# Patient Record
Sex: Male | Born: 1955 | Race: White | Hispanic: No | Marital: Married | Smoking: Former smoker
Health system: Southern US, Community
[De-identification: ages and names within clinical notes are randomized; demographics above are authoritative.]

## PROBLEM LIST (undated history)

## (undated) DIAGNOSIS — I10 Essential (primary) hypertension: Secondary | ICD-10-CM

## (undated) DIAGNOSIS — R7303 Prediabetes: Secondary | ICD-10-CM

## (undated) DIAGNOSIS — G4730 Sleep apnea, unspecified: Secondary | ICD-10-CM

## (undated) DIAGNOSIS — Z87442 Personal history of urinary calculi: Secondary | ICD-10-CM

## (undated) HISTORY — PX: NO PAST SURGERIES: SHX2092

---

## 2005-06-26 HISTORY — PX: COLONOSCOPY: SHX174

## 2006-04-24 ENCOUNTER — Ambulatory Visit: Payer: Self-pay | Admitting: General Surgery

## 2012-04-11 LAB — HM HEPATITIS C SCREENING LAB: HM Hepatitis Screen: NEGATIVE

## 2012-04-26 ENCOUNTER — Ambulatory Visit: Payer: Self-pay | Admitting: Family Medicine

## 2012-05-21 ENCOUNTER — Ambulatory Visit: Payer: Self-pay | Admitting: Family Medicine

## 2012-06-10 DIAGNOSIS — N138 Other obstructive and reflux uropathy: Secondary | ICD-10-CM | POA: Insufficient documentation

## 2012-06-10 DIAGNOSIS — R339 Retention of urine, unspecified: Secondary | ICD-10-CM | POA: Insufficient documentation

## 2012-06-10 DIAGNOSIS — N401 Enlarged prostate with lower urinary tract symptoms: Secondary | ICD-10-CM | POA: Insufficient documentation

## 2014-09-04 DIAGNOSIS — N400 Enlarged prostate without lower urinary tract symptoms: Secondary | ICD-10-CM | POA: Insufficient documentation

## 2015-02-21 ENCOUNTER — Other Ambulatory Visit: Payer: Self-pay | Admitting: Family Medicine

## 2015-03-19 ENCOUNTER — Encounter: Payer: Self-pay | Admitting: Family Medicine

## 2015-03-19 ENCOUNTER — Ambulatory Visit (INDEPENDENT_AMBULATORY_CARE_PROVIDER_SITE_OTHER): Payer: PRIVATE HEALTH INSURANCE | Admitting: Family Medicine

## 2015-03-19 VITALS — BP 122/78 | HR 60 | Temp 98.4°F | Resp 16 | Wt 185.0 lb

## 2015-03-19 DIAGNOSIS — M7581 Other shoulder lesions, right shoulder: Secondary | ICD-10-CM | POA: Insufficient documentation

## 2015-03-19 DIAGNOSIS — R197 Diarrhea, unspecified: Secondary | ICD-10-CM | POA: Diagnosis not present

## 2015-03-19 DIAGNOSIS — R0683 Snoring: Secondary | ICD-10-CM

## 2015-03-19 DIAGNOSIS — G4733 Obstructive sleep apnea (adult) (pediatric): Secondary | ICD-10-CM

## 2015-03-19 DIAGNOSIS — M778 Other enthesopathies, not elsewhere classified: Secondary | ICD-10-CM | POA: Insufficient documentation

## 2015-03-19 DIAGNOSIS — N400 Enlarged prostate without lower urinary tract symptoms: Secondary | ICD-10-CM

## 2015-03-19 DIAGNOSIS — E7800 Pure hypercholesterolemia, unspecified: Secondary | ICD-10-CM

## 2015-03-19 NOTE — Patient Instructions (Signed)

## 2015-03-19 NOTE — Progress Notes (Signed)
Patient ID: ORLANDER NORWOOD, male   DOB: Mar 19, 1956, 59 y.o.   MRN: [id number] Name: LANORRIS KALISZ   MRN: [id number]    DOB: [birth year]   Date:03/19/2015       Progress Note  Subjective  Chief Complaint  Chief Complaint  Patient presents with  . Fatigue  . Diarrhea    Diarrhea  This is a new problem. The current episode started in the past 7 days. The stool consistency is described as watery. Associated symptoms include myalgias and sweats. Pertinent negatives include no abdominal pain, chills or vomiting. Nothing aggravates the symptoms. He has tried analgesics for the symptoms.   No problem-specific assessment & plan notes found for this encounter.  History reviewed. No pertinent past medical history.  Social History  Substance Use Topics  . Smoking status: Former Research scientist (life sciences)  . Smokeless tobacco: Not on file     Comment: QUIT IN 1970'S  . Alcohol Use: 0.0 oz/week    0 Standard drinks or equivalent per week     Comment: OCCASIONALLY   Family History  Problem Relation Age of Onset  . Dementia Mother   . Ovarian cancer Mother   . Stroke Father   . Diabetes Father    Past Surgical History  Procedure Laterality Date  . No past surgeries      Current outpatient prescriptions:  .  Omega 3 1200 MG CAPS, Take 2 capsules by mouth daily., Disp: , Rfl:  .  pravastatin (PRAVACHOL) 20 MG tablet, TAKE ONE TABLET BY MOUTH ONE TIME DAILY, Disp: 30 tablet, Rfl: 6  No Known Allergies  Review of Systems  Constitutional: Negative for chills.  HENT: Negative.   Eyes: Negative.   Respiratory: Negative.   Cardiovascular: Negative.   Gastrointestinal: Positive for diarrhea. Negative for vomiting and abdominal pain.  Genitourinary: Negative.   Musculoskeletal: Positive for myalgias.  Skin: Negative.   Neurological: Positive for weakness.  Endo/Heme/Allergies: Negative.   Psychiatric/Behavioral: Negative.    Objective  Filed Vitals:   03/19/15 1605  BP: 122/78  Pulse: 60   Temp: 98.4 F (36.9 C)  TempSrc: Oral  Resp: 16  Weight: 185 lb (83.915 kg)   Physical Exam  Constitutional: He is oriented to person, place, and time and well-developed, well-nourished, and in no distress.  HENT:  Head: Normocephalic and atraumatic.  Eyes: Conjunctivae and EOM are normal.  Neck: Neck supple.  Cardiovascular: Normal rate.   Pulmonary/Chest: Breath sounds normal.  Abdominal: Soft. He exhibits no distension and no mass. There is no tenderness. There is no rebound and no guarding.  Hyperactive bowel sounds.  Neurological: He is alert and oriented to person, place, and time.  Skin: Skin is warm and dry.  Psychiatric: Memory, affect and judgment normal.   Assessment & Plan 1. Diarrhea Recent onset with fatigue. No vomiting but appetite has diminished. No fever or loose stool today. Recommend Pepto-Bismol or Imodium-AD with increased fluid intake and bland diet. If no further improvement in 2 days, should return for lab tests.

## 2015-04-26 ENCOUNTER — Encounter: Payer: Self-pay | Admitting: Family Medicine

## 2015-04-26 ENCOUNTER — Ambulatory Visit (INDEPENDENT_AMBULATORY_CARE_PROVIDER_SITE_OTHER): Payer: PRIVATE HEALTH INSURANCE | Admitting: Family Medicine

## 2015-04-26 VITALS — BP 122/82 | HR 62 | Temp 98.3°F | Resp 16 | Ht 71.25 in | Wt 185.6 lb

## 2015-04-26 DIAGNOSIS — N400 Enlarged prostate without lower urinary tract symptoms: Secondary | ICD-10-CM

## 2015-04-26 DIAGNOSIS — Z0000 Encounter for general adult medical examination without abnormal findings: Secondary | ICD-10-CM | POA: Diagnosis not present

## 2015-04-26 DIAGNOSIS — G4733 Obstructive sleep apnea (adult) (pediatric): Secondary | ICD-10-CM

## 2015-04-26 DIAGNOSIS — Z23 Encounter for immunization: Secondary | ICD-10-CM | POA: Diagnosis not present

## 2015-04-26 DIAGNOSIS — E7800 Pure hypercholesterolemia, unspecified: Secondary | ICD-10-CM | POA: Diagnosis not present

## 2015-04-26 LAB — POCT URINALYSIS DIPSTICK
Bilirubin, UA: NEGATIVE
GLUCOSE UA: NEGATIVE
Ketones, UA: NEGATIVE
Leukocytes, UA: NEGATIVE
NITRITE UA: NEGATIVE
PH UA: 6.5
Protein, UA: NEGATIVE
Spec Grav, UA: >=1.03
UROBILINOGEN UA: 0.2

## 2015-04-26 NOTE — Progress Notes (Signed)
Patient ID: Wayne Holland, male   DOB: [birth year], 59 y.o.   MRN: [id number]       Patient: Wayne Holland, Male    DOB: Feb 15, 1956, 59 y.o.   MRN: [id number] Visit Date: 04/26/2015  Today's Provider: Vernie Murders, PA   Chief Complaint  Patient presents with  . Annual Exam   Subjective:    Annual physical exam Wayne Holland is a 59 y.o. male who presents today for health maintenance and complete physical. He feels well. He reports exercising twice a week. He reports he is sleeping well( average 6-7 hours a night) .  -----------------------------------------------------------------   Review of Systems  Constitutional: Negative.   HENT: Negative.   Eyes: Negative.   Respiratory: Negative.   Cardiovascular: Negative.   Gastrointestinal: Negative.   Endocrine: Negative.   Genitourinary: Negative.   Musculoskeletal: Negative.   Skin: Negative.   Allergic/Immunologic: Negative.   Neurological: Negative.   Hematological: Negative.   Psychiatric/Behavioral: Negative.   All other systems reviewed and are negative.   Social History He  reports that he has quit smoking. He does not have any smokeless tobacco history on file. He reports that he drinks alcohol. He reports that he does not use illicit drugs. Social History   Social History  . Marital Status: Married    Spouse Name: N/A  . Number of Children: N/A  . Years of Education: N/A   Social History Main Topics  . Smoking status: Former Research scientist (life sciences)  . Smokeless tobacco: None     Comment: QUIT IN 1970'S  . Alcohol Use: 0.0 oz/week    0 Standard drinks or equivalent per week     Comment: OCCASIONALLY  . Drug Use: No  . Sexual Activity: Not Asked   Other Topics Concern  . None   Social History Narrative    Patient Active Problem List   Diagnosis Date Noted  . Pure hypercholesterolemia 03/19/2015  . BPH (benign prostatic hyperplasia) 03/19/2015  . Obstructive sleep apnea 03/19/2015  . Snoring  03/19/2015  . Tendinitis of right shoulder 03/19/2015  . Incomplete bladder emptying 06/10/2012  . Benign prostatic hyperplasia with urinary obstruction 06/10/2012    Past Surgical History  Procedure Laterality Date  . No past surgeries      Family History  Family Status  Relation Status Death Age  . Mother Deceased 32  . Father Deceased 47   His family history includes Dementia in his mother; Diabetes in his father; Ovarian cancer in his mother; Stroke in his father.    No Known Allergies  Previous Medications   OMEGA 3 1200 MG CAPS    Take 2 capsules by mouth daily.   PRAVASTATIN (PRAVACHOL) 20 MG TABLET    TAKE ONE TABLET BY MOUTH ONE TIME DAILY   Patient Care Team: Margo Common, PA as PCP - General (Physician Assistant)    Objective:   Vitals: BP 122/82 mmHg  Pulse 62  Temp(Src) 98.3 F (36.8 C) (Oral)  Resp 16  Ht 5' 11.25" (1.81 m)  Wt 185 lb 9.6 oz (84.188 kg)  BMI 25.70 kg/m2   Physical Exam  Depression Screen Good spirits and no suicidal ideation.  Assessment & Plan:     Routine Health Maintenance and Physical Exam  Exercise Activities and Dietary recommendations Goals   Continue regular exercise 20-30 minutes 3-4 times a week with low fat diet.  Immunization History  Administered Date(s) Administered  . Tdap 04/07/2011    Health Maintenance  Topic  Date Due  . INFLUENZA VACCINE  01/25/2015  . COLONOSCOPY  04/24/2016  . TETANUS/TDAP  04/06/2021  . Hepatitis C Screening  Completed      Discussed health benefits of physical activity, and encouraged him to engage in regular exercise appropriate for his age and condition.    -------------------------------------------------------------------- 1. Annual physical exam Good general health. Last Tdap 04-07-11 and normal colonoscopy  04-24-06 by Dr. Bary Castilla. Feeling well without complaints.  - POCT urinalysis dipstick  2. Pure hypercholesterolemia Tolerating Pravastatin 20 mg qd without  side effects. Will recheck labs and continue low fat diet. - CBC with Differential/Platelet - COMPLETE METABOLIC PANEL WITH GFR - Lipid panel - TSH  3. Obstructive sleep apnea Sleeping well and rare snoring now. Sleep study on 04-24-06 showed desat to 81.2% and was on CPAP 8 cm H2O for a few years. Has not used it in recent year. Good energy level. May use prn.  4. BPH (benign prostatic hyperplasia) Followed by Dr. Jacqlyn Larsen annually (last exam with PSA was in February 2016). Denies nocturia, hesitancy or dribbling. Continue follow up with urologist annually.  5. Need for influenza vaccination - Flu Vaccine QUAD 36+ mos PF IM (Fluarix & Fluzone Quad PF)

## 2015-04-26 NOTE — Patient Instructions (Signed)

## 2015-04-27 LAB — COMPREHENSIVE METABOLIC PANEL
ALBUMIN: 4.6 g/dL (ref 3.5–5.5)
ALT: 24 IU/L (ref 0–44)
AST: 16 IU/L (ref 0–40)
Albumin/Globulin Ratio: 1.9 (ref 1.1–2.5)
Alkaline Phosphatase: 68 IU/L (ref 39–117)
BUN / CREAT RATIO: 12 (ref 9–20)
BUN: 11 mg/dL (ref 6–24)
Bilirubin Total: 0.7 mg/dL (ref 0.0–1.2)
CO2: 26 mmol/L (ref 18–29)
CREATININE: 0.9 mg/dL (ref 0.76–1.27)
Calcium: 9.5 mg/dL (ref 8.7–10.2)
Chloride: 97 mmol/L (ref 97–106)
GFR, EST AFRICAN AMERICAN: 108 mL/min/{1.73_m2} (ref >59)
GFR, EST NON AFRICAN AMERICAN: 93 mL/min/{1.73_m2} (ref >59)
GLOBULIN, TOTAL: 2.4 g/dL (ref 1.5–4.5)
GLUCOSE: 101 mg/dL — AB (ref 65–99)
Potassium: 4.7 mmol/L (ref 3.5–5.2)
Sodium: 139 mmol/L (ref 136–144)
TOTAL PROTEIN: 7 g/dL (ref 6.0–8.5)

## 2015-04-27 LAB — LIPID PANEL
CHOLESTEROL TOTAL: 165 mg/dL (ref 100–199)
Chol/HDL Ratio: 2.6 ratio units (ref 0.0–5.0)
HDL: 64 mg/dL (ref >39)
LDL Calculated: 84 mg/dL (ref 0–99)
TRIGLYCERIDES: 87 mg/dL (ref 0–149)
VLDL Cholesterol Cal: 17 mg/dL (ref 5–40)

## 2015-04-27 LAB — CBC WITH DIFFERENTIAL/PLATELET
BASOS ABS: 0 10*3/uL (ref 0.0–0.2)
Basos: 0 %
EOS (ABSOLUTE): 0 10*3/uL (ref 0.0–0.4)
Eos: 1 %
Hematocrit: 45.7 % (ref 37.5–51.0)
Hemoglobin: 16.5 g/dL (ref 12.6–17.7)
IMMATURE GRANS (ABS): 0 10*3/uL (ref 0.0–0.1)
Immature Granulocytes: 0 %
LYMPHS ABS: 2.2 10*3/uL (ref 0.7–3.1)
LYMPHS: 30 %
MCH: 32 pg (ref 26.6–33.0)
MCHC: 36.1 g/dL — AB (ref 31.5–35.7)
MCV: 89 fL (ref 79–97)
MONOCYTES: 7 %
Monocytes Absolute: 0.5 10*3/uL (ref 0.1–0.9)
NEUTROS ABS: 4.5 10*3/uL (ref 1.4–7.0)
Neutrophils: 62 %
PLATELETS: 260 10*3/uL (ref 150–379)
RBC: 5.15 x10E6/uL (ref 4.14–5.80)
RDW: 12.7 % (ref 12.3–15.4)
WBC: 7.2 10*3/uL (ref 3.4–10.8)

## 2015-04-27 LAB — TSH: TSH: 0.455 u[IU]/mL (ref 0.450–4.500)

## 2015-04-28 ENCOUNTER — Telehealth: Payer: Self-pay

## 2015-04-28 NOTE — Telephone Encounter (Signed)
-----   Message from Margo Common, [place] sent at 04/27/2015  6:05 PM EDT ----- All blood tests normal. Recheck with annual physical.

## 2015-04-28 NOTE — Telephone Encounter (Signed)
Patient's wife Olin Hauser advised as directed below.

## 2015-08-26 ENCOUNTER — Encounter: Payer: Self-pay | Admitting: Family Medicine

## 2015-08-26 ENCOUNTER — Ambulatory Visit
Admission: RE | Admit: 2015-08-26 | Discharge: 2015-08-26 | Disposition: A | Discharge status: Home / Self Care | Payer: PRIVATE HEALTH INSURANCE | Source: Ambulatory Visit | Attending: Family Medicine | Admitting: Family Medicine

## 2015-08-26 ENCOUNTER — Ambulatory Visit (INDEPENDENT_AMBULATORY_CARE_PROVIDER_SITE_OTHER): Payer: PRIVATE HEALTH INSURANCE | Admitting: Family Medicine

## 2015-08-26 DIAGNOSIS — M7989 Other specified soft tissue disorders: Secondary | ICD-10-CM | POA: Insufficient documentation

## 2015-08-26 DIAGNOSIS — M25572 Pain in left ankle and joints of left foot: Secondary | ICD-10-CM | POA: Insufficient documentation

## 2015-08-26 NOTE — Patient Instructions (Signed)
Ankle Sprain  An ankle sprain is an injury to the strong, fibrous tissues (ligaments) that hold the bones of your ankle joint together.   CAUSES  An ankle sprain is usually caused by a fall or by twisting your ankle. Ankle sprains most commonly occur when you step on the outer edge of your foot, and your ankle turns inward. People who participate in sports are more prone to these types of injuries.   SYMPTOMS    Pain in your ankle. The pain may be present at rest or only when you are trying to stand or walk.   Swelling.   Bruising. Bruising may develop immediately or within 1 to 2 days after your injury.   Difficulty standing or walking, particularly when turning corners or changing directions.  DIAGNOSIS   Your caregiver will ask you details about your injury and perform a physical exam of your ankle to determine if you have an ankle sprain. During the physical exam, your caregiver will press on and apply pressure to specific areas of your foot and ankle. Your caregiver will try to move your ankle in certain ways. An X-ray exam may be done to be sure a bone was not broken or a ligament did not separate from one of the bones in your ankle (avulsion fracture).   TREATMENT   Certain types of braces can help stabilize your ankle. Your caregiver can make a recommendation for this. Your caregiver may recommend the use of medicine for pain. If your sprain is severe, your caregiver may refer you to a surgeon who helps to restore function to parts of your skeletal system (orthopedist) or a physical therapist.  HOME CARE INSTRUCTIONS    Apply ice to your injury for 1-2 days or as directed by your caregiver. Applying ice helps to reduce inflammation and pain.    Put ice in a plastic bag.    Place a towel between your skin and the bag.    Leave the ice on for 15-20 minutes at a time, every 2 hours while you are awake.   Only take over-the-counter or prescription medicines for pain, discomfort, or fever as directed by  your caregiver.   Elevate your injured ankle above the level of your heart as much as possible for 2-3 days.   If your caregiver recommends crutches, use them as instructed. Gradually put weight on the affected ankle. Continue to use crutches or a cane until you can walk without feeling pain in your ankle.   If you have a plaster splint, wear the splint as directed by your caregiver. Do not rest it on anything harder than a pillow for the first 24 hours. Do not put weight on it. Do not get it wet. You may take it off to take a shower or bath.   You may have been given an elastic bandage to wear around your ankle to provide support. If the elastic bandage is too tight (you have numbness or tingling in your foot or your foot becomes cold and blue), adjust the bandage to make it comfortable.   If you have an air splint, you may blow more air into it or let air out to make it more comfortable. You may take your splint off at night and before taking a shower or bath. Wiggle your toes in the splint several times per day to decrease swelling.  SEEK MEDICAL CARE IF:    You have rapidly increasing bruising or swelling.   Your toes feel   extremely cold or you lose feeling in your foot.   Your pain is not relieved with medicine.  SEEK IMMEDIATE MEDICAL CARE IF:   Your toes are numb or blue.   You have severe pain that is increasing.  MAKE SURE YOU:    Understand these instructions.   Will watch your condition.   Will get help right away if you are not doing well or get worse.     This information is not intended to replace advice given to you by your health care provider. Make sure you discuss any questions you have with your health care provider.     Document Released: 06/12/2005 Document Revised: 07/03/2014 Document Reviewed: 06/24/2011  Elsevier Interactive Patient Education 2016 [employer]

## 2015-08-26 NOTE — Progress Notes (Signed)
Patient ID: Wayne Holland, male   DOB: 07-Nov-1955, 60 y.o.   MRN: [id number]       Patient: Wayne Holland Male    DOB: 01/26/1956   60 y.o.   MRN: [id number] Visit Date: 08/26/2015  Today's Provider: Vernie Murders, PA   Chief Complaint  Patient presents with  . Ankle Pain    X 1 week.    Subjective:    Ankle Pain  There was no injury mechanism. The pain is present in the left ankle. The pain is at a severity of 4/10. The pain is mild (to moderate). The pain has been worsening since onset. Associated symptoms include an inability to bear weight. Pertinent negatives include no numbness or tingling. The symptoms are aggravated by movement and palpation. He has tried nothing for the symptoms.  Patient reports that he has also noticed swelling in his left ankle. He reports that it is sometimes tender when palpitating. Denies any injuries.   No past medical history on file. Patient Active Problem List   Diagnosis Date Noted  . Pure hypercholesterolemia 03/19/2015  . BPH (benign prostatic hyperplasia) 03/19/2015  . Obstructive sleep apnea 03/19/2015  . Snoring 03/19/2015  . Tendinitis of right shoulder 03/19/2015  . Incomplete bladder emptying 06/10/2012  . Benign prostatic hyperplasia with urinary obstruction 06/10/2012   Past Surgical History  Procedure Laterality Date  . No past surgeries     Family History  Problem Relation Age of Onset  . Dementia Mother   . Ovarian cancer Mother   . Stroke Father   . Diabetes Father    No Known Allergies   Previous Medications   OMEGA 3 1200 MG CAPS    Take 2 capsules by mouth daily.   PRAVASTATIN (PRAVACHOL) 20 MG TABLET    TAKE ONE TABLET BY MOUTH ONE TIME DAILY    Review of Systems  Constitutional: Negative.   Cardiovascular: Negative.   Musculoskeletal: Positive for joint swelling.  Skin: Negative.   Neurological: Negative.  Negative for tingling and numbness.    Social History  Substance Use Topics  . Smoking  status: Former Research scientist (life sciences)  . Smokeless tobacco: Not on file     Comment: QUIT IN 1970'S  . Alcohol Use: 0.0 oz/week    0 Standard drinks or equivalent per week     Comment: OCCASIONALLY   Objective:   BP 122/80 mmHg  Pulse 60  Temp(Src) 99 F (37.2 C)  Resp 16  Ht 6' (1.829 m)  Wt 190 lb (86.183 kg)  BMI 25.76 kg/m2  Physical Exam  Constitutional: He is oriented to person, place, and time. He appears well-developed and well-nourished. No distress.  HENT:  Head: Normocephalic and atraumatic.  Right Ear: Hearing normal.  Left Ear: Hearing normal.  Nose: Nose normal.  Eyes: Conjunctivae and lids are normal. Right eye exhibits no discharge. Left eye exhibits no discharge. No scleral icterus.  Pulmonary/Chest: Effort normal. No respiratory distress.  Musculoskeletal: Normal range of motion.  Mild pain in left lateral ankle and plantar surface of the left heel. No redness or crepitus. Good pulses and sensation in skin. Some extra discomfort to invert ankle with weight bearing.  Neurological: He is alert and oriented to person, place, and time.  Skin: Skin is intact. No lesion and no rash noted.  Psychiatric: He has a normal mood and affect. His speech is normal and behavior is normal. Thought content normal.      Assessment & Plan:  1. Left ankle pain Onset over the past week without known injury. Has had some discomfort in the left heel over the past month but it is improved now. No fever, redness, bruising or swelling today. Good pulses and mild discomfort to invert the left ankle. Will get x-ray evaluation and recommend using Ibuprofen 200 mg 3 tablets TID with meals for 10-14 days. Recheck pending x-ray report. May use Ace wrap and good arch supports. - DG Ankle Complete Left       Vernie Murders, PA  [facility]

## 2015-09-22 ENCOUNTER — Other Ambulatory Visit: Payer: Self-pay | Admitting: Family Medicine

## 2015-09-22 DIAGNOSIS — E7800 Pure hypercholesterolemia, unspecified: Secondary | ICD-10-CM

## 2015-11-15 ENCOUNTER — Encounter: Payer: Self-pay | Admitting: Family Medicine

## 2015-11-15 ENCOUNTER — Ambulatory Visit (INDEPENDENT_AMBULATORY_CARE_PROVIDER_SITE_OTHER): Payer: PRIVATE HEALTH INSURANCE | Admitting: Family Medicine

## 2015-11-15 ENCOUNTER — Ambulatory Visit
Admission: RE | Admit: 2015-11-15 | Discharge: 2015-11-15 | Disposition: A | Discharge status: Home / Self Care | Payer: PRIVATE HEALTH INSURANCE | Source: Ambulatory Visit | Attending: Family Medicine | Admitting: Family Medicine

## 2015-11-15 VITALS — BP 142/84 | HR 64 | Temp 98.5°F | Resp 16 | Wt 188.0 lb

## 2015-11-15 DIAGNOSIS — M25512 Pain in left shoulder: Secondary | ICD-10-CM | POA: Insufficient documentation

## 2015-11-15 DIAGNOSIS — M19019 Primary osteoarthritis, unspecified shoulder: Secondary | ICD-10-CM | POA: Insufficient documentation

## 2015-11-15 DIAGNOSIS — M7532 Calcific tendinitis of left shoulder: Secondary | ICD-10-CM | POA: Diagnosis not present

## 2015-11-15 MED ORDER — MELOXICAM 15 MG PO TABS: 15.0000 mg | ORAL_TABLET | Freq: Every day | ORAL | Status: DC

## 2015-11-15 NOTE — Progress Notes (Signed)
Patient ID: Wayne Holland, male   DOB: 05-27-1956, 60 y.o.   MRN: [id number]       Patient: Wayne Holland Male    DOB: [birth year]   60 y.o.   MRN: [id number] Visit Date: 11/15/2015  Today's Provider: Vernie Murders, PA   Chief Complaint  Patient presents with  . Shoulder Pain   Subjective:    HPI  Patient states he developed left shoulder pain around Thursday last week, may 18th. He noticed it during the day. He does not recall any injury or any unusual activities that would have brought this on. Pain is sharp when he pushes the area of the left shoulder and when he tries to raise his left arm. No arm weakness, ability to grip objects is normal as long as he does not lift his left arm up. No tingling or numbness sensation in the area. He took Advil for the pain at first and then took Ibuprofen the past 2 days.  Patient Active Problem List   Diagnosis Date Noted  . Pure hypercholesterolemia 03/19/2015  . BPH (benign prostatic hyperplasia) 03/19/2015  . Obstructive sleep apnea 03/19/2015  . Snoring 03/19/2015  . Tendinitis of right shoulder 03/19/2015  . Incomplete bladder emptying 06/10/2012  . Benign prostatic hyperplasia with urinary obstruction 06/10/2012   Past Surgical History  Procedure Laterality Date  . No past surgeries     Family History  Problem Relation Age of Onset  . Dementia Mother   . Ovarian cancer Mother   . Stroke Father   . Diabetes Father    No Known Allergies Previous Medications   OMEGA 3 1200 MG CAPS    Take 2 capsules by mouth daily.   PRAVASTATIN (PRAVACHOL) 20 MG TABLET    TAKE ONE TABLET BY MOUTH ONE TIME DAILY    Review of Systems  Constitutional: Negative.   Respiratory: Negative.   Cardiovascular: Negative.   Musculoskeletal: Positive for arthralgias (decreased range of motion present due to pain).    Social History  Substance Use Topics  . Smoking status: Former Research scientist (life sciences)  . Smokeless tobacco: Not on file     Comment:  QUIT IN 1970'S  . Alcohol Use: 0.0 oz/week    0 Standard drinks or equivalent per week     Comment: OCCASIONALLY   Objective:   BP 142/84 mmHg  Pulse 64  Temp(Src) 98.5 F (36.9 C)  Resp 16  Wt 188 lb (85.276 kg)  Physical Exam  Constitutional: He is oriented to person, place, and time. He appears well-developed and well-nourished. No distress.  HENT:  Head: Normocephalic and atraumatic.  Right Ear: Hearing normal.  Left Ear: Hearing normal.  Nose: Nose normal.  Eyes: Conjunctivae and lids are normal. Right eye exhibits no discharge. Left eye exhibits no discharge. No scleral icterus.  Pulmonary/Chest: Effort normal. No respiratory distress.  Musculoskeletal: He exhibits tenderness.  Left anterior shoulder over biceps tendon. Sharp pain with elevating humerus laterally. Good grip strength and normal pulses.   Neurological: He is alert and oriented to person, place, and time.  Skin: Skin is intact. No lesion and no rash noted.  Psychiatric: He has a normal mood and affect. His speech is normal and behavior is normal. Thought content normal.      Assessment & Plan:     1. Left shoulder pain Onset over the past weekend without specific injury known. Will get x-ray evaluation and start NSAID. Suspect tendinitis versus bursitis. May need referral to orthopedist if no  better with conservative treatment. Apply moist heat and limit lifting. Recheck pending reports. - DG Shoulder Left - meloxicam (MOBIC) 15 MG tablet; Take 1 tablet (15 mg total) by mouth daily.  Dispense: 30 tablet; Refill: [place]

## 2015-11-15 NOTE — Patient Instructions (Signed)
Bursitis °Bursitis is inflammation and irritation of a bursa, which is one of the small, fluid-filled sacs that cushion and protect the moving parts of your body. These sacs are located between bones and muscles, muscle attachments, or skin areas next to bones. A bursa protects these structures from the wear and tear that results from frequent movement. °An inflamed bursa causes pain and swelling. Fluid may build up inside the sac. Bursitis is most common near joints, especially the knees, elbows, hips, and shoulders. °CAUSES °Bursitis can be caused by:  °· Injury from: °¨ A direct blow, like falling on your knee or elbow. °¨ Overuse of a joint (repetitive stress). °· Infection. This can happen if bacteria gets into a bursa through a cut or scrape near a joint. °· Diseases that cause joint inflammation, such as gout and rheumatoid arthritis. °RISK FACTORS °You may be at risk for bursitis if you:  °· Have a job or hobby that involves a lot of repetitive stress on your joints. °· Have a condition that weakens your body's defense system (immune system), such as diabetes, cancer, or HIV. °· Lift and reach overhead often. °· Kneel or lean on hard surfaces often. °· Run or walk often. °SIGNS AND SYMPTOMS °The most common signs and symptoms of bursitis are: °· Pain that gets worse when you move the affected body part or put weight on it. °· Inflammation. °· Stiffness. °Other signs and symptoms may include: °· Redness. °· Tenderness. °· Warmth. °· Pain that continues after rest. °· Fever and chills. This may occur in bursitis caused by infection. °DIAGNOSIS °Bursitis may be diagnosed by:  °· Medical history and physical exam. °· MRI. °· A procedure to drain fluid from the bursa with a needle (aspiration). The fluid may be checked for signs of infection or gout. °· Blood tests to rule out other causes of inflammation. °TREATMENT  °Bursitis can usually be treated at home with rest, ice, compression, and elevation (RICE). For  mild bursitis, RICE treatment may be all you need. Other treatments may include: °· Nonsteroidal anti-inflammatory drugs (NSAIDs) to treat pain and inflammation. °· Corticosteroids to fight inflammation. You may have these drugs injected into and around the area of bursitis. °· Aspiration of bursitis fluid to relieve pain and improve movement. °· Antibiotic medicine to treat an infected bursa. °· A splint, brace, or walking aid. °· Physical therapy if you continue to have pain or limited movement. °· Surgery to remove a damaged or infected bursa. This may be needed if you have a very bad case of bursitis or if other treatments have not worked. °HOME CARE INSTRUCTIONS  °· Take medicines only as directed by your health care provider. °· If you were prescribed an antibiotic medicine, finish it all even if you start to feel better. °· Rest the affected area as directed by your health care provider. °¨ Keep the area elevated. °¨ Avoid activities that make pain worse. °· Apply ice to the injured area: °¨ Place ice in a plastic bag. °¨ Place a towel between your skin and the bag. °¨ Leave the ice on for 20 minutes, 2-3 times a day. °· Use splints, braces, pads, or walking aids as directed by your health care provider. °· Keep all follow-up visits as directed by your health care provider. This is important. °PREVENTION  °· Wear knee pads if you kneel often. °· Wear sturdy running or walking shoes that fit you well. °· Take regular breaks from repetitive activity. °· Warm   up by stretching before doing any strenuous activity.  Maintain a healthy weight or lose weight as recommended by your health care provider. Ask your health care provider if you need help.  Exercise regularly. Start any new physical activity gradually. SEEK MEDICAL CARE IF:   Your bursitis is not responding to treatment or home care.  You have a fever.  You have chills.   This information is not intended to replace advice given to you by your  health care provider. Make sure you discuss any questions you have with your health care provider.   Document Released: 06/09/2000 Document Revised: 03/03/2015 Document Reviewed: 09/01/2013 Elsevier Interactive Patient Education 2016 Elsevier Inc[employer] Bicipital Tendonitis Bicipital tendonitis refers to redness, soreness, and swelling (inflammation) or irritation of the bicep tendon. The biceps muscle is located between the elbow and shoulder of the inner arm. The tendon heads, similar to pieces of rope, connect the bicep muscle to the shoulder socket. They are called short head and long head tendons. When tendonitis occurs, the long head tendon is inflamed and swollen, and may be thickened or partially torn.  Bicipital tendonitis can occur with other problems as well, such as arthritis in the shoulder or acromioclavicular joints, tears in the tendons, or other rotator cuff problems.  CAUSES  Overuse of of the arms for overhead activities is the major cause of tendonitis. Many athletes, such as swimmers, baseball players, and tennis players are prone to bicipital tendonitis. Jobs that require manual labor or routine chores, especially chores involving overhead activities can result in overuse and tendonitis. SYMPTOMS Symptoms may include:  Pain in and around the front of the shoulder. Pain may be worse with overhead motion.  Pain or aching that radiates down the arm.  Clicking or shifting sensations in the shoulder. DIAGNOSIS Your caregiver may perform the following:  Physical exam and tests of the biceps and shoulder to observe range of motion, strength, and stability.  X-rays or magnetic resonance imaging (MRI) to confirm the diagnosis. In most common cases, these tests are not necessary. Since other problems may exist in the shoulder or rotator cuff, additional tests may be recommended. TREATMENT Treatment may include the following:  Medications  Your caregiver may prescribe  over-the-counter pain relievers.  Steroid injections, such as cortisone, may be recommended. These may help to reduce inflammation and pain.  Physical Therapy - Your caregiver may recommend gentle exercises with the arm. These can help restore strength and range of motion. They may be done at home or with a physical therapist's supervision and input.  Surgery - Arthroscopic or open surgery sometimes is necessary. Surgery may include:  Reattachment or repair of the tendon at the shoulder socket.  Removal of the damaged section of the tendon.  Anchoring the tendon to a different area of the shoulder (tenodesis). HOME CARE INSTRUCTIONS   Avoid overhead motion of the affected arm or any other motion that causes pain.  Take medication for pain as directed. Do not take these for more than 3 weeks, unless directed to do so by your caregiver.  Ice the affected area for 20 minutes at a time, 3-4 times per day. Place a towel on the skin over the painful area and the ice or cold pack over the towel. Do not place ice directly on the skin.  Perform gentle exercises at home as directed. These will increase strength and flexibility. PREVENTION  Modify your activities as much as possible to protect your arm. A physical therapist or sports  medicine physician can help you understand options for safe motion.  Avoid repetitive overhead pulling, lifting, reaching, and throwing until your caregiver tells you it is ok to resume these activities. SEEK MEDICAL CARE IF:  Your pain worsens.  You have difficulty moving the affected arm.  You have trouble performing any of the self-care instructions. MAKE SURE YOU:   Understand these instructions.  Will watch your condition.  Will get help right away if you are not doing well or get worse.   This information is not intended to replace advice given to you by your health care provider. Make sure you discuss any questions you have with your health care  provider.   Document Released: 07/15/2010 Document Revised: 09/04/2011 Document Reviewed: 12/30/2014 Elsevier Interactive Patient Education [employer].

## 2016-04-28 ENCOUNTER — Other Ambulatory Visit: Payer: Self-pay | Admitting: Family Medicine

## 2016-04-28 DIAGNOSIS — E7800 Pure hypercholesterolemia, unspecified: Secondary | ICD-10-CM

## 2016-05-04 ENCOUNTER — Ambulatory Visit (INDEPENDENT_AMBULATORY_CARE_PROVIDER_SITE_OTHER): Payer: PRIVATE HEALTH INSURANCE | Admitting: Family Medicine

## 2016-05-04 ENCOUNTER — Encounter: Payer: Self-pay | Admitting: Family Medicine

## 2016-05-04 VITALS — BP 134/88 | HR 60 | Temp 98.1°F | Resp 16 | Wt 187.4 lb

## 2016-05-04 DIAGNOSIS — E7800 Pure hypercholesterolemia, unspecified: Secondary | ICD-10-CM

## 2016-05-04 DIAGNOSIS — G4733 Obstructive sleep apnea (adult) (pediatric): Secondary | ICD-10-CM | POA: Diagnosis not present

## 2016-05-04 DIAGNOSIS — Z0000 Encounter for general adult medical examination without abnormal findings: Secondary | ICD-10-CM | POA: Diagnosis not present

## 2016-05-04 DIAGNOSIS — N401 Enlarged prostate with lower urinary tract symptoms: Secondary | ICD-10-CM | POA: Diagnosis not present

## 2016-05-04 DIAGNOSIS — Z1211 Encounter for screening for malignant neoplasm of colon: Secondary | ICD-10-CM | POA: Diagnosis not present

## 2016-05-04 DIAGNOSIS — N138 Other obstructive and reflux uropathy: Secondary | ICD-10-CM | POA: Diagnosis not present

## 2016-05-04 DIAGNOSIS — Z23 Encounter for immunization: Secondary | ICD-10-CM

## 2016-05-04 DIAGNOSIS — Z114 Encounter for screening for human immunodeficiency virus [HIV]: Secondary | ICD-10-CM | POA: Diagnosis not present

## 2016-05-04 DIAGNOSIS — Z2911 Encounter for prophylactic immunotherapy for respiratory syncytial virus (RSV): Secondary | ICD-10-CM

## 2016-05-04 LAB — IFOBT (OCCULT BLOOD): IMMUNOLOGICAL FECAL OCCULT BLOOD TEST: NEGATIVE

## 2016-05-04 NOTE — Progress Notes (Signed)
Patient: Wayne Holland, Male    DOB: Oct 05, 1955, 60 y.o.   MRN: [id number] Visit Date: 05/04/2016  Today's Provider: Vernie Murders, PA   Chief Complaint  Patient presents with  . Annual Exam   Subjective:    Annual physical exam Wayne Holland is a 60 y.o. male who presents today for health maintenance and complete physical. He feels well. He reports exercising 3 times per week. He reports he is sleeping fairly well.  -----------------------------------------------------------------   Review of Systems  Constitutional: Negative.   HENT: Negative.   Eyes: Negative.   Respiratory: Negative.   Cardiovascular: Negative.   Gastrointestinal: Negative.   Endocrine: Negative.   Genitourinary: Negative.   Musculoskeletal: Negative.   Skin: Negative.   Allergic/Immunologic: Negative.   Neurological: Negative.   Hematological: Negative.   Psychiatric/Behavioral: Negative.     Social History      He  reports that he has quit smoking. He does not have any smokeless tobacco history on file. He reports that he drinks alcohol. He reports that he does not use drugs.       Social History   Social History  . Marital status: Married    Spouse name: N/A  . Number of children: N/A  . Years of education: N/A   Social History Main Topics  . Smoking status: Former Research scientist (life sciences)  . Smokeless tobacco: None     Comment: QUIT IN 1970'S  . Alcohol use 0.0 oz/week     Comment: OCCASIONALLY  . Drug use: No  . Sexual activity: Not Asked   Other Topics Concern  . None   Social History Narrative  . None    Patient Active Problem List   Diagnosis Date Noted  . Pure hypercholesterolemia 03/19/2015  . BPH (benign prostatic hyperplasia) 03/19/2015  . Obstructive sleep apnea 03/19/2015  . Snoring 03/19/2015  . Tendinitis of right shoulder 03/19/2015  . Incomplete bladder emptying 06/10/2012  . Benign prostatic hyperplasia with urinary obstruction 06/10/2012    Past  Surgical History:  Procedure Laterality Date  . NO PAST SURGERIES      Family History        Family Status  Relation Status  . Mother Deceased at age 81  . Father Deceased at age 56        His family history includes Dementia in his mother; Diabetes in his father; Ovarian cancer in his mother; Stroke in his father.     No Known Allergies   Current Outpatient Prescriptions:  .  Omega 3 1200 MG CAPS, Take 2 capsules by mouth daily., Disp: , Rfl:  .  pravastatin (PRAVACHOL) 20 MG tablet, TAKE ONE TABLET BY MOUTH ONE TIME DAILY, Disp: 30 tablet, Rfl: 6   Patient Care Team: Margo Common, PA as PCP - General (Physician Assistant)      Objective:   Vitals: BP 134/88 (BP Location: Right Arm, Patient Position: Sitting, Cuff Size: Normal)   Pulse 60   Temp 98.1 F (36.7 C) (Oral)   Resp 16   Wt 187 lb 6.4 oz (85 kg)   BMI 25.42 kg/m    Physical Exam  Constitutional: He is oriented to person, place, and time. He appears well-developed and well-nourished.  HENT:  Head: Normocephalic and atraumatic.  Right Ear: External ear normal.  Left Ear: External ear normal.  Nose: Nose normal.  Mouth/Throat: Oropharynx is clear and moist.  Eyes: Conjunctivae and EOM are normal. Pupils are equal, round, and  reactive to light. Right eye exhibits no discharge.  Neck: Normal range of motion. Neck supple. No tracheal deviation present. No thyromegaly present.  Cardiovascular: Normal rate, regular rhythm, normal heart sounds and intact distal pulses.   No murmur heard. Pulmonary/Chest: Effort normal and breath sounds normal. No respiratory distress. He has no wheezes. He has no rales. He exhibits no tenderness.  Abdominal: Soft. He exhibits no distension and no mass. There is no tenderness. There is no rebound and no guarding.  Genitourinary: Rectum normal. Rectal exam shows guaiac negative stool.  Musculoskeletal: Normal range of motion. He exhibits no edema or tenderness.    Lymphadenopathy:    He has no cervical adenopathy.  Neurological: He is alert and oriented to person, place, and time. He has normal reflexes. No cranial nerve deficit. He exhibits normal muscle tone. Coordination normal.  Skin: Skin is warm and dry. No rash noted. No erythema.  Psychiatric: He has a normal mood and affect. His behavior is normal. Judgment and thought content normal.     Depression Screen PHQ 2/9 Scores 05/04/2016  PHQ - 2 Score 0      Assessment & Plan:     Routine Health Maintenance and Physical Exam  Exercise Activities and Dietary recommendations Goals    None      Immunization History  Administered Date(s) Administered  . Influenza,inj,Quad PF,36+ Mos 04/26/2015  . Tdap 04/07/2011    Health Maintenance  Topic Date Due  . HIV Screening  12/16/1970  . ZOSTAVAX  12/16/2015  . INFLUENZA VACCINE  01/25/2016  . COLONOSCOPY  04/24/2016  . TETANUS/TDAP  04/06/2021  . Hepatitis C Screening  Completed     Discussed health benefits of physical activity, and encouraged him to engage in regular exercise appropriate for his age and condition.    --------------------------------------------------------------------  1. Annual physical exam Good general health. Continues to walk for exercise 20 minuts 3 days a week. Will up date immunizations. Due for repeat colonoscopy. Given anticipatory guidance. - CBC with Differential/Platelet - Comprehensive metabolic panel  2. Pure hypercholesterolemia Still taking Pravastatin 20 mg qd with Omega-3 Fish Oil qd without side effects. Will recheck labs and follow up pending reports. - CBC with Differential/Platelet - Comprehensive metabolic panel - Lipid panel - TSH  3. Benign prostatic hyperplasia with urinary obstruction No new obstructive symptoms. Followed by Dr. Jacqlyn Larsen annually.  4. Obstructive sleep apnea Sleeping well. Using CPAP at 8 cm H2O pressure each night. Tolerating well and good energy level. -  CBC with Differential/Platelet - TSH  5. Colon cancer screening Due for 10 year colonoscopy screening. Dr. Bary Castilla did the last one and he prefers to go back there. - Ambulatory referral to General Surgery  6. Need for zoster vaccination - Varicella-zoster vaccine subcutaneous  7. Need for influenza vaccination - Flu Vaccine QUAD 36+ mos PF IM (Fluarix & Fluzone Quad PF)  8. Screening for HIV (human immunodeficiency virus) - HIV antibody   Vernie Murders, PA  [facility]

## 2016-05-05 ENCOUNTER — Telehealth: Payer: Self-pay

## 2016-05-05 LAB — CBC WITH DIFFERENTIAL/PLATELET
BASOS ABS: 0 10*3/uL (ref 0.0–0.2)
Basos: 0 %
EOS (ABSOLUTE): 0.1 10*3/uL (ref 0.0–0.4)
Eos: 2 %
Hematocrit: 45.2 % (ref 37.5–51.0)
Hemoglobin: 16.1 g/dL (ref 12.6–17.7)
IMMATURE GRANS (ABS): 0 10*3/uL (ref 0.0–0.1)
IMMATURE GRANULOCYTES: 0 %
LYMPHS: 31 %
Lymphocytes Absolute: 1.9 10*3/uL (ref 0.7–3.1)
MCH: 31.4 pg (ref 26.6–33.0)
MCHC: 35.6 g/dL (ref 31.5–35.7)
MCV: 88 fL (ref 79–97)
MONOS ABS: 0.6 10*3/uL (ref 0.1–0.9)
Monocytes: 9 %
NEUTROS ABS: 3.5 10*3/uL (ref 1.4–7.0)
NEUTROS PCT: 58 %
PLATELETS: 302 10*3/uL (ref 150–379)
RBC: 5.12 x10E6/uL (ref 4.14–5.80)
RDW: 12.6 % (ref 12.3–15.4)
WBC: 6 10*3/uL (ref 3.4–10.8)

## 2016-05-05 LAB — COMPREHENSIVE METABOLIC PANEL
ALT: 31 IU/L (ref 0–44)
AST: 21 IU/L (ref 0–40)
Albumin/Globulin Ratio: 1.7 (ref 1.2–2.2)
Albumin: 4.5 g/dL (ref 3.6–4.8)
Alkaline Phosphatase: 73 IU/L (ref 39–117)
BILIRUBIN TOTAL: 0.6 mg/dL (ref 0.0–1.2)
BUN/Creatinine Ratio: 13 (ref 10–24)
BUN: 13 mg/dL (ref 8–27)
CO2: 27 mmol/L (ref 18–29)
Calcium: 9.6 mg/dL (ref 8.6–10.2)
Chloride: 97 mmol/L (ref 96–106)
Creatinine, Ser: 0.99 mg/dL (ref 0.76–1.27)
GFR calc non Af Amer: 82 mL/min/{1.73_m2} (ref >59)
GFR, EST AFRICAN AMERICAN: 95 mL/min/{1.73_m2} (ref >59)
GLUCOSE: 94 mg/dL (ref 65–99)
Globulin, Total: 2.7 g/dL (ref 1.5–4.5)
POTASSIUM: 4.9 mmol/L (ref 3.5–5.2)
Sodium: 139 mmol/L (ref 134–144)
TOTAL PROTEIN: 7.2 g/dL (ref 6.0–8.5)

## 2016-05-05 LAB — LIPID PANEL
CHOL/HDL RATIO: 3.2 ratio (ref 0.0–5.0)
CHOLESTEROL TOTAL: 171 mg/dL (ref 100–199)
HDL: 53 mg/dL (ref >39)
LDL Calculated: 100 mg/dL — ABNORMAL HIGH (ref 0–99)
TRIGLYCERIDES: 91 mg/dL (ref 0–149)
VLDL Cholesterol Cal: 18 mg/dL (ref 5–40)

## 2016-05-05 LAB — TSH: TSH: 0.387 u[IU]/mL — AB (ref 0.450–4.500)

## 2016-05-05 LAB — HIV ANTIBODY (ROUTINE TESTING W REFLEX): HIV SCREEN 4TH GENERATION: NONREACTIVE

## 2016-05-05 NOTE — Telephone Encounter (Signed)
-----   Message from Margo Common, [place] sent at 05/05/2016  7:52 AM EST ----- All blood tests essentially normal. Only slight variation in thyroid test (TSH). Recheck annually.

## 2016-05-05 NOTE — Telephone Encounter (Signed)
Patient has been advised. KW 

## 2016-05-22 ENCOUNTER — Encounter: Payer: Self-pay | Admitting: *Deleted

## 2016-05-24 ENCOUNTER — Ambulatory Visit (INDEPENDENT_AMBULATORY_CARE_PROVIDER_SITE_OTHER): Payer: PRIVATE HEALTH INSURANCE | Admitting: General Surgery

## 2016-05-24 ENCOUNTER — Encounter: Payer: Self-pay | Admitting: General Surgery

## 2016-05-24 VITALS — BP 156/86 | HR 76 | Resp 12 | Ht 71.0 in | Wt 170.0 lb

## 2016-05-24 DIAGNOSIS — Z1211 Encounter for screening for malignant neoplasm of colon: Secondary | ICD-10-CM | POA: Insufficient documentation

## 2016-05-24 MED ORDER — POLYETHYLENE GLYCOL 3350 17 GM/SCOOP PO POWD: ORAL | 0 refills | Status: DC

## 2016-05-24 NOTE — Patient Instructions (Signed)
Colonoscopy, Adult A colonoscopy is an exam to look at the entire large intestine. During the exam, a lubricated, bendable tube is inserted into the anus and then passed into the rectum, colon, and other parts of the large intestine. A colonoscopy is often done as a part of normal colorectal screening or in response to certain symptoms, such as anemia, persistent diarrhea, abdominal pain, and blood in the stool. The exam can help screen for and diagnose medical problems, including:  Tumors.  Polyps.  Inflammation.  Areas of bleeding. Tell a health care provider about:  Any allergies you have.  All medicines you are taking, including vitamins, herbs, eye drops, creams, and over-the-counter medicines.  Any problems you or family members have had with anesthetic medicines.  Any blood disorders you have.  Any surgeries you have had.  Any medical conditions you have.  Any problems you have had passing stool. What are the risks? Generally, this is a safe procedure. However, problems may occur, including:  Bleeding.  A tear in the intestine.  A reaction to medicines given during the exam.  Infection (rare). What happens before the procedure? Eating and drinking restrictions  Follow instructions from your health care provider about eating and drinking, which may include:  A few days before the procedure - follow a low-fiber diet. Avoid nuts, seeds, dried fruit, raw fruits, and vegetables.  1-3 days before the procedure - follow a clear liquid diet. Drink only clear liquids, such as clear broth or bouillon, black coffee or tea, clear juice, clear soft drinks or sports drinks, gelatin desert, and popsicles. Avoid any liquids that contain red or purple dye.  On the day of the procedure - do not eat or drink anything during the 2 hours before the procedure, or within the time period that your health care provider recommends. Bowel prep  If you were prescribed an oral bowel prep to  clean out your colon:  Take it as told by your health care provider. Starting the day before your procedure, you will need to drink a large amount of medicated liquid. The liquid will cause you to have multiple loose stools until your stool is almost clear or light green.  If your skin or anus gets irritated from diarrhea, you may use these to relieve the irritation:  Medicated wipes, such as adult wet wipes with aloe and vitamin E.  A skin soothing-product like petroleum jelly.  If you vomit while drinking the bowel prep, take a break for up to 60 minutes and then begin the bowel prep again. If vomiting continues and you cannot take the bowel prep without vomiting, call your health care provider. General instructions  Ask your health care provider about changing or stopping your regular medicines. This is especially important if you are taking diabetes medicines or blood thinners.  Plan to have someone take you home from the hospital or clinic. What happens during the procedure?  An IV tube may be inserted into one of your veins.  You will be given medicine to help you relax (sedative).  To reduce your risk of infection:  Your health care team will wash or sanitize their hands.  Your anal area will be washed with soap.  You will be asked to lie on your side with your knees bent.  Your health care provider will lubricate a long, thin, flexible tube. The tube will have a camera and a light on the end.  The tube will be inserted into your anus.  The tube will be gently eased through your rectum and colon.  Air will be delivered into your colon to keep it open. You may feel some pressure or cramping.  The camera will be used to take images during the procedure.  A small tissue sample may be removed from your body to be examined under a microscope (biopsy). If any potential problems are found, the tissue will be sent to a lab for testing.  If small polyps are found, your health  care provider may remove them and have them checked for cancer cells.  The tube that was inserted into your anus will be slowly removed. The procedure may vary among health care providers and hospitals. What happens after the procedure?  Your blood pressure, heart rate, breathing rate, and blood oxygen level will be monitored until the medicines you were given have worn off.  Do not drive for 24 hours after the exam.  You may have a small amount of blood in your stool.  You may pass gas and have mild abdominal cramping or bloating due to the air that was used to inflate your colon during the exam.  It is up to you to get the results of your procedure. Ask your health care provider, or the department performing the procedure, when your results will be ready. This information is not intended to replace advice given to you by your health care provider. Make sure you discuss any questions you have with your health care provider. Document Released: 06/09/2000 Document Revised: 12/31/2015 Document Reviewed: 08/24/2015 Elsevier Interactive Patient Education  2017 Elsevier Inc.  

## 2016-05-24 NOTE — Progress Notes (Signed)
Patient ID: Wayne Holland, male   DOB: [birth year], 60 y.o.   MRN: [id number]  Chief Complaint  Patient presents with  . Colonoscopy    HPI Wayne Holland is a 60 y.o. male Here today for a evaluation of a screening colonoscopy. Last colonoscopy was in 2007. Patient states no GI problems at this time.  Denies family history of colon cancer or polyps.  HPI  No past medical history on file.  Past Surgical History:  Procedure Laterality Date  . COLONOSCOPY  2007  . NO PAST SURGERIES      Family History  Problem Relation Age of Onset  . Dementia Mother   . Ovarian cancer Mother   . Stroke Father   . Diabetes Father   . Colon cancer Neg Hx     Social History Social History  Substance Use Topics  . Smoking status: Former Research scientist (life sciences)  . Smokeless tobacco: Not on file     Comment: QUIT IN 1970'S  . Alcohol use 0.0 oz/week     Comment: OCCASIONALLY    No Known Allergies  Current Outpatient Prescriptions  Medication Sig Dispense Refill  . Omega 3 1200 MG CAPS Take 2 capsules by mouth daily.    . pravastatin (PRAVACHOL) 20 MG tablet TAKE ONE TABLET BY MOUTH ONE TIME DAILY 30 tablet 6  . polyethylene glycol powder (GLYCOLAX/MIRALAX) powder 255 grams one bottle for colonoscopy prep 255 g 0   No current facility-administered medications for this visit.     Review of Systems Review of Systems  Constitutional: Negative.   Respiratory: Negative.   Cardiovascular: Negative.   Gastrointestinal: Negative.     Blood pressure (!) 156/86, pulse 76, resp. rate 12, height 5\' 11"  (1.803 m), weight 170 lb (77.1 kg).  Physical Exam Physical Exam  Constitutional: He is oriented to person, place, and time. He appears well-developed and well-nourished.  HENT:  Mouth/Throat: Oropharynx is clear and moist.  Eyes: Conjunctivae are normal. No scleral icterus.  Neck: Neck supple.  Cardiovascular: Normal rate, regular rhythm and normal heart sounds.   Pulmonary/Chest: Effort normal  and breath sounds normal.  Abdominal: Soft. Bowel sounds are normal. There is no tenderness.  Lymphadenopathy:    He has no cervical adenopathy.  Neurological: He is alert and oriented to person, place, and time.  Skin: Skin is warm and dry.  Psychiatric: His behavior is normal.    Data Reviewed The 04/24/2006 colonoscopy report was reviewed. Normal exam.  Assessment    Candidate for screening colonoscopy.    Plan          Colonoscopy with possible biopsy/polypectomy prn: Information regarding the procedure, including its potential risks and complications (including but not limited to perforation of the bowel, which may require emergency surgery to repair, and bleeding) was verbally given to the patient. Educational information regarding lower intestinal endoscopy was given to the patient. Written instructions for how to complete the bowel prep using Miralax were provided. The importance of drinking ample fluids to avoid dehydration as a result of the prep emphasized.  Patient has been scheduled for a colonoscopy on 06-28-16 at [facility]. This patient has been asked to discontinue fish oil one week prior to procedure.   This information has been scribed by Gaspar Cola CMA.   [name] 05/24/2016, 9:34 PM

## 2016-06-01 ENCOUNTER — Encounter: Payer: Self-pay | Admitting: General Surgery

## 2016-06-21 ENCOUNTER — Telehealth: Payer: Self-pay | Admitting: *Deleted

## 2016-06-21 NOTE — Telephone Encounter (Signed)
Patient was contacted today and confirms no medication changes since last office visit.   He reports that he does have Miralax prescription.   We will proceed as scheduled with colonoscopy on 06-28-16 at [facility].   This patient was instructed to call the office should he have further questions.

## 2016-06-27 ENCOUNTER — Encounter: Payer: Self-pay | Admitting: *Deleted

## 2016-06-28 ENCOUNTER — Ambulatory Visit: Payer: PRIVATE HEALTH INSURANCE | Admitting: Anesthesiology

## 2016-06-28 ENCOUNTER — Encounter: Payer: Self-pay | Admitting: *Deleted

## 2016-06-28 ENCOUNTER — Ambulatory Visit
Admission: RE | Admit: 2016-06-28 | Discharge: 2016-06-28 | Disposition: A | Discharge status: Home / Self Care | Payer: PRIVATE HEALTH INSURANCE | Source: Ambulatory Visit | Attending: General Surgery | Admitting: General Surgery

## 2016-06-28 ENCOUNTER — Encounter
Admission: RE | Disposition: A | Discharge status: Home / Self Care | Payer: Self-pay | Source: Ambulatory Visit | Attending: General Surgery

## 2016-06-28 DIAGNOSIS — Z87891 Personal history of nicotine dependence: Secondary | ICD-10-CM | POA: Diagnosis not present

## 2016-06-28 DIAGNOSIS — K219 Gastro-esophageal reflux disease without esophagitis: Secondary | ICD-10-CM | POA: Insufficient documentation

## 2016-06-28 DIAGNOSIS — Z1211 Encounter for screening for malignant neoplasm of colon: Secondary | ICD-10-CM

## 2016-06-28 DIAGNOSIS — Z79899 Other long term (current) drug therapy: Secondary | ICD-10-CM | POA: Insufficient documentation

## 2016-06-28 DIAGNOSIS — G4730 Sleep apnea, unspecified: Secondary | ICD-10-CM | POA: Insufficient documentation

## 2016-06-28 DIAGNOSIS — D175 Benign lipomatous neoplasm of intra-abdominal organs: Secondary | ICD-10-CM | POA: Diagnosis not present

## 2016-06-28 HISTORY — PX: COLONOSCOPY WITH PROPOFOL: SHX5780

## 2016-06-28 HISTORY — DX: Sleep apnea, unspecified: G47.30

## 2016-06-28 SURGERY — COLONOSCOPY WITH PROPOFOL
Anesthesia: General

## 2016-06-28 MED ORDER — PROPOFOL 500 MG/50ML IV EMUL
INTRAVENOUS | Status: AC
  Filled 2016-06-28: qty 50

## 2016-06-28 MED ORDER — SODIUM CHLORIDE 0.9 % IV SOLN
INTRAVENOUS | Status: DC
  Administered 2016-06-28: 1000 mL via INTRAVENOUS

## 2016-06-28 MED ORDER — PROPOFOL 500 MG/50ML IV EMUL
INTRAVENOUS | Status: DC | PRN
Start: 2016-06-28 — End: 2016-06-28
  Administered 2016-06-28: 110 ug/kg/min via INTRAVENOUS

## 2016-06-28 MED ORDER — PROPOFOL 10 MG/ML IV BOLUS
INTRAVENOUS | Status: DC | PRN
  Administered 2016-06-28: 100 mg via INTRAVENOUS

## 2016-06-28 NOTE — Op Note (Signed)
[facility] Gastroenterology Patient Name: Wayne Holland Procedure Date: 06/28/2016 9:47 AM MRN: [id number] Account #: [id number] Date of Birth: Apr 07, 1956 Admit Type: Outpatient Age: 61 Room: [facility] ENDO ROOM 1 Gender: Male Note Status: Finalized Procedure:            Colonoscopy Indications:          Screening for colorectal malignant neoplasm Providers:            [name], MD Referring MD:         Signe Colt (Referring MD) Medicines:            Monitored Anesthesia Care Complications:        No immediate complications. Procedure:            Pre-Anesthesia Assessment:                       - Prior to the procedure, a History and Physical was                        performed, and patient medications, allergies and                        sensitivities were reviewed. The patient's tolerance of                        previous anesthesia was reviewed.                       - The risks and benefits of the procedure and the                        sedation options and risks were discussed with the                        patient. All questions were answered and informed                        consent was obtained.                       After obtaining informed consent, the colonoscope was                        passed under direct vision. Throughout the procedure,                        the patient's blood pressure, pulse, and oxygen                        saturations were monitored continuously. The Olympus                        CF-HQ190L Colonoscope (S#. [id number]) was introduced                        through the anus and advanced to the the terminal                        ileum. The colonoscopy was performed without  difficulty. The patient tolerated the procedure well.                        The quality of the bowel preparation was excellent. Findings:      There was a small lipoma, 8 mm in diameter, appendiceal orifice.   Biopsies were taken with a cold forceps for histology.      The retroflexed view of the distal rectum and anal verge was normal and       showed no anal or rectal abnormalities. Impression:           - Small lipoma at the appendiceal orifice. Biopsied.                       - The distal rectum and anal verge are normal on                        retroflexion view. Recommendation:       - Telephone endoscopist for pathology results in 1 week.                       - Repeat colonoscopy in 10 years for screening purposes. Procedure Code(s):    --- Professional ---                       [phone], Colonoscopy, flexible; with biopsy, single or                        multiple Diagnosis Code(s):    --- Professional ---                       Z12.11, Encounter for screening for malignant neoplasm                        of colon                       D17.5, Benign lipomatous neoplasm of intra-abdominal                        organs CPT copyright 2016 American Medical Association. All rights reserved. The codes documented in this report are preliminary and upon coder review may  be revised to meet current compliance requirements. [name], MD 06/28/2016 10:23:01 AM This report has been signed electronically. Number of Addenda: 0 Note Initiated On: 06/28/2016 9:47 AM Scope Withdrawal Time: 0 hours 10 minutes 40 seconds  Total Procedure Duration: 0 hours 17 minutes 51 seconds       [facility]

## 2016-06-28 NOTE — Anesthesia Preprocedure Evaluation (Signed)
Anesthesia Evaluation  Patient identified by MRN, date of birth, ID band Patient awake    Reviewed: Allergy & Precautions, H&P , NPO status , Patient's Chart, lab work & pertinent test results  History of Anesthesia Complications Negative for: history of anesthetic complications  Airway Mallampati: III  TM Distance: >3 FB Neck ROM: full    Dental  (+) Poor Dentition, Chipped   Pulmonary neg shortness of breath, sleep apnea , former smoker,    Pulmonary exam normal breath sounds clear to auscultation       Cardiovascular Exercise Tolerance: Good (-) angina(-) Past MI and (-) DOE negative cardio ROS Normal cardiovascular exam Rhythm:regular Rate:Normal     Neuro/Psych negative neurological ROS  negative psych ROS   GI/Hepatic negative GI ROS, Neg liver ROS, neg GERD  ,  Endo/Other  negative endocrine ROS  Renal/GU negative Renal ROS  negative genitourinary   Musculoskeletal   Abdominal   Peds  Hematology negative hematology ROS (+)   Anesthesia Other Findings Past Medical History: No date: Sleep apnea  Past Surgical History: 2007: COLONOSCOPY No date: NO PAST SURGERIES  BMI    Body Mass Index:  26.36 kg/m      Reproductive/Obstetrics negative OB ROS                             Anesthesia Physical Anesthesia Plan  ASA: III  Anesthesia Plan: General   Post-op Pain Management:    Induction:   Airway Management Planned:   Additional Equipment:   Intra-op Plan:   Post-operative Plan:   Informed Consent: I have reviewed the patients History and Physical, chart, labs and discussed the procedure including the risks, benefits and alternatives for the proposed anesthesia with the patient or authorized representative who has indicated his/her understanding and acceptance.   Dental Advisory Given  Plan Discussed with: Anesthesiologist, CRNA and Surgeon  Anesthesia Plan  Comments:         Anesthesia Quick Evaluation

## 2016-06-28 NOTE — Transfer of Care (Signed)
Immediate Anesthesia Transfer of Care Note  Patient: Wayne Holland  Procedure(s) Performed: Procedure(s): COLONOSCOPY WITH PROPOFOL (N/A)  Patient Location: PACU and Endoscopy Unit  Anesthesia Type:General  Level of Consciousness: patient cooperative and lethargic  Airway & Oxygen Therapy: Patient Spontanous Breathing and Patient connected to nasal cannula oxygen  Post-op Assessment: Report given to RN and Post -op Vital signs reviewed and stable  Post vital signs: Reviewed and stable  Last Vitals:  Vitals:   06/28/16 0935 06/28/16 1027  BP: (!) 169/107 110/60  Pulse: 72 70  Resp: 16 17  Temp: (!) 35.7 C     Last Pain:  Vitals:   06/28/16 0935  TempSrc: Tympanic         Complications: No apparent anesthesia complications

## 2016-06-28 NOTE — Anesthesia Postprocedure Evaluation (Signed)
Anesthesia Post Note  Patient: Wayne Holland  Procedure(s) Performed: Procedure(s) (LRB): COLONOSCOPY WITH PROPOFOL (N/A)  Patient location during evaluation: Endoscopy Anesthesia Type: General Level of consciousness: awake and alert Pain management: pain level controlled Vital Signs Assessment: post-procedure vital signs reviewed and stable Respiratory status: spontaneous breathing, nonlabored ventilation, respiratory function stable and patient connected to nasal cannula oxygen Cardiovascular status: blood pressure returned to baseline and stable Postop Assessment: no signs of nausea or vomiting Anesthetic complications: no     Last Vitals:  Vitals:   06/28/16 1050 06/28/16 1057  BP: (!) 154/86 (!) 153/95  Pulse:    Resp:    Temp:      Last Pain:  Vitals:   06/28/16 1050  TempSrc:   PainSc: 0-No pain                 Precious Haws Geena Weinhold

## 2016-06-28 NOTE — H&P (Signed)
Wayne Holland [id number] 26-Dec-1955     HPI:  Healthy male for screening colonoscopy. Tolerated prep well.   Prescriptions Prior to Admission  Medication Sig Dispense Refill Last Dose  . Omega 3 1200 MG CAPS Take 2 capsules by mouth daily.   Taking  . polyethylene glycol powder (GLYCOLAX/MIRALAX) powder 255 grams one bottle for colonoscopy prep 255 g 0   . pravastatin (PRAVACHOL) 20 MG tablet TAKE ONE TABLET BY MOUTH ONE TIME DAILY 30 tablet 6 Taking   No Known Allergies Past Medical History:  Diagnosis Date  . Sleep apnea    Past Surgical History:  Procedure Laterality Date  . COLONOSCOPY  2007  . NO PAST SURGERIES     Social History   Social History  . Marital status: Married    Spouse name: N/A  . Number of children: N/A  . Years of education: N/A   Occupational History  . Not on file.   Social History Main Topics  . Smoking status: Former Research scientist (life sciences)  . Smokeless tobacco: Never Used     Comment: QUIT IN 1970'S  . Alcohol use 0.0 oz/week     Comment: OCCASIONALLY  . Drug use: No  . Sexual activity: Not on file   Other Topics Concern  . Not on file   Social History Narrative  . No narrative on file   Social History   Social History Narrative  . No narrative on file     ROS: Negative.     PE: HEENT: Negative. Lungs: Clear. Cardio: RR.   Assessment/Plan:  Proceed with planned endoscopy.   [name] 06/28/2016

## 2016-06-29 ENCOUNTER — Encounter: Payer: Self-pay | Admitting: General Surgery

## 2016-06-29 LAB — SURGICAL PATHOLOGY

## 2016-07-03 ENCOUNTER — Telehealth: Payer: Self-pay | Admitting: *Deleted

## 2016-07-03 NOTE — Telephone Encounter (Signed)
Patient notified as instructed, verbalized understanding. Placed in recalls.

## 2016-07-03 NOTE — Telephone Encounter (Signed)
-----   Message from [name], MD sent at 06/29/2016  2:54 PM EST -----  Notify path fine. Repeat exam in10years.  ----- Message ----- From: Interface, Lab In Three Zero One Sent: 06/29/2016   2:24 PM To: [name], MD

## 2016-08-11 ENCOUNTER — Encounter: Payer: Self-pay | Admitting: Family Medicine

## 2016-08-11 ENCOUNTER — Other Ambulatory Visit: Payer: Self-pay

## 2016-08-11 ENCOUNTER — Ambulatory Visit (INDEPENDENT_AMBULATORY_CARE_PROVIDER_SITE_OTHER): Payer: PRIVATE HEALTH INSURANCE | Admitting: Family Medicine

## 2016-08-11 VITALS — BP 138/98 | HR 65 | Temp 98.1°F | Resp 16 | Wt 196.0 lb

## 2016-08-11 DIAGNOSIS — G4733 Obstructive sleep apnea (adult) (pediatric): Secondary | ICD-10-CM | POA: Diagnosis not present

## 2016-08-11 DIAGNOSIS — I10 Essential (primary) hypertension: Secondary | ICD-10-CM

## 2016-08-11 MED ORDER — LISINOPRIL 5 MG PO TABS: 5.0000 mg | ORAL_TABLET | Freq: Every day | ORAL | 3 refills | Status: DC

## 2016-08-11 NOTE — Patient Instructions (Signed)
Hypertension Hypertension, commonly called high blood pressure, is when the force of blood pumping through your arteries is too strong. Your arteries are the blood vessels that carry blood from your heart throughout your body. A blood pressure reading consists of a higher number over a lower number, such as 110/72. The higher number (systolic) is the pressure inside your arteries when your heart pumps. The lower number (diastolic) is the pressure inside your arteries when your heart relaxes. Ideally you want your blood pressure below 120/80. Hypertension forces your heart to work harder to pump blood. Your arteries may become narrow or stiff. Having untreated or uncontrolled hypertension can cause heart attack, stroke, kidney disease, and other problems. What increases the risk? Some risk factors for high blood pressure are controllable. Others are not. Risk factors you cannot control include:  Race. You may be at higher risk if you are African American.  Age. Risk increases with age.  Gender. Men are at higher risk than women before age 45 years. After age 65, women are at higher risk than men. Risk factors you can control include:  Not getting enough exercise or physical activity.  Being overweight.  Getting too much fat, sugar, calories, or salt in your diet.  Drinking too much alcohol. What are the signs or symptoms? Hypertension does not usually cause signs or symptoms. Extremely high blood pressure (hypertensive crisis) may cause headache, anxiety, shortness of breath, and nosebleed. How is this diagnosed? To check if you have hypertension, your health care provider will measure your blood pressure while you are seated, with your arm held at the level of your heart. It should be measured at least twice using the same arm. Certain conditions can cause a difference in blood pressure between your right and left arms. A blood pressure reading that is higher than normal on one occasion does  not mean that you need treatment. If it is not clear whether you have high blood pressure, you may be asked to return on a different day to have your blood pressure checked again. Or, you may be asked to monitor your blood pressure at home for 1 or more weeks. How is this treated? Treating high blood pressure includes making lifestyle changes and possibly taking medicine. Living a healthy lifestyle can help lower high blood pressure. You may need to change some of your habits. Lifestyle changes may include:  Following the DASH diet. This diet is high in fruits, vegetables, and whole grains. It is low in salt, red meat, and added sugars.  Keep your sodium intake below 2,300 mg per day.  Getting at least 30-45 minutes of aerobic exercise at least 4 times per week.  Losing weight if necessary.  Not smoking.  Limiting alcoholic beverages.  Learning ways to reduce stress. Your health care provider may prescribe medicine if lifestyle changes are not enough to get your blood pressure under control, and if one of the following is true:  You are 18-59 years of age and your systolic blood pressure is above 140.  You are 60 years of age or older, and your systolic blood pressure is above 150.  Your diastolic blood pressure is above 90.  You have diabetes, and your systolic blood pressure is over 140 or your diastolic blood pressure is over 90.  You have kidney disease and your blood pressure is above 140/90.  You have heart disease and your blood pressure is above 140/90. Your personal target blood pressure may vary depending on your medical   conditions, your age, and other factors. Follow these instructions at home:  Have your blood pressure rechecked as directed by your health care provider.  Take medicines only as directed by your health care provider. Follow the directions carefully. Blood pressure medicines must be taken as prescribed. The medicine does not work as well when you skip  doses. Skipping doses also puts you at risk for problems.  Do not smoke.  Monitor your blood pressure at home as directed by your health care provider. Contact a health care provider if:  You think you are having a reaction to medicines taken.  You have recurrent headaches or feel dizzy.  You have swelling in your ankles.  You have trouble with your vision. Get help right away if:  You develop a severe headache or confusion.  You have unusual weakness, numbness, or feel faint.  You have severe chest or abdominal pain.  You vomit repeatedly.  You have trouble breathing. This information is not intended to replace advice given to you by your health care provider. Make sure you discuss any questions you have with your health care provider. Document Released: 06/12/2005 Document Revised: 11/18/2015 Document Reviewed: 04/04/2013 Elsevier Interactive Patient Education  2017 Elsevier Inc.  

## 2016-08-11 NOTE — Progress Notes (Signed)
Patient: Wayne Holland Male    DOB: 1955-12-07   61 y.o.   MRN: [id number] Visit Date: 08/11/2016  Today's Provider: Vernie Murders, PA   Chief Complaint  Patient presents with  . Hypertension   Subjective:    Hypertension  This is a new problem. Episode onset: patient noticed on January 3rd. Associated symptoms include headaches (at times). Risk factors for coronary artery disease include dyslipidemia and male gender. Past treatments include nothing.   Patient Active Problem List   Diagnosis Date Noted  . Encounter for screening colonoscopy 05/24/2016  . Pure hypercholesterolemia 03/19/2015  . BPH (benign prostatic hyperplasia) 03/19/2015  . Obstructive sleep apnea 03/19/2015  . Snoring 03/19/2015  . Tendinitis of right shoulder 03/19/2015  . Incomplete bladder emptying 06/10/2012  . Benign prostatic hyperplasia with urinary obstruction 06/10/2012   Past Surgical History:  Procedure Laterality Date  . COLONOSCOPY  2007  . COLONOSCOPY WITH PROPOFOL N/A 06/28/2016   Procedure: COLONOSCOPY WITH PROPOFOL;  Surgeon: [name], MD;  [facility] ENDOSCOPY;  Service: Endoscopy;  Laterality: N/A;  . NO PAST SURGERIES     Family History  Problem Relation Age of Onset  . Dementia Mother   . Ovarian cancer Mother   . Stroke Father   . Diabetes Father   . Colon cancer Neg Hx    No Known Allergies    Previous Medications   OMEGA 3 1200 MG CAPS    Take 2 capsules by mouth daily.   PRAVASTATIN (PRAVACHOL) 20 MG TABLET    TAKE ONE TABLET BY MOUTH ONE TIME DAILY    Review of Systems  Constitutional: Negative.   Respiratory: Negative.   Cardiovascular: Negative.   Neurological: Positive for headaches (at times).    Social History  Substance Use Topics  . Smoking status: Former Research scientist (life sciences)  . Smokeless tobacco: Never Used     Comment: QUIT IN 1970'S  . Alcohol use 0.0 oz/week     Comment: OCCASIONALLY   Objective:   BP (!) 138/98 (BP Location: Right Arm,  Patient Position: Sitting, Cuff Size: Normal)   Pulse 65   Temp 98.1 F (36.7 C) (Oral)   Resp 16   Wt 196 lb (88.9 kg)   SpO2 98%   BMI 27.34 kg/m   Physical Exam  Constitutional: He is oriented to person, place, and time. He appears well-developed and well-nourished. No distress.  HENT:  Head: Normocephalic and atraumatic.  Right Ear: Hearing normal.  Left Ear: Hearing normal.  Nose: Nose normal.  Eyes: Conjunctivae and lids are normal. Right eye exhibits no discharge. Left eye exhibits no discharge. No scleral icterus.  Neck: Neck supple.  Cardiovascular: Normal rate and regular rhythm.   Pulmonary/Chest: Effort normal and breath sounds normal. No respiratory distress.  Abdominal: Soft.  Musculoskeletal: Normal range of motion.  Neurological: He is alert and oriented to person, place, and time.  Skin: Skin is intact. No lesion and no rash noted.  Psychiatric: He has a normal mood and affect. His speech is normal and behavior is normal. Thought content normal.      Assessment & Plan:     1. Essential hypertension Has tried to exercise regularly, restrict salt/sodium in diet and follow the low fat diet. BP at home has been 150/90 to 170/96 over the past month. Will add ACE inhibitor and recheck in 1 month. - lisinopril (PRINIVIL,ZESTRIL) 5 MG tablet; Take 1 tablet (5 mg total) by mouth daily.  Dispense: 30 tablet; Refill: 3  2. Obstructive sleep apnea Still sleeping well. Using CPAP at 8 cm H2O each night for the past 4-5 years.

## 2016-09-08 ENCOUNTER — Ambulatory Visit (INDEPENDENT_AMBULATORY_CARE_PROVIDER_SITE_OTHER): Payer: PRIVATE HEALTH INSURANCE | Admitting: Family Medicine

## 2016-09-08 ENCOUNTER — Encounter: Payer: Self-pay | Admitting: Family Medicine

## 2016-09-08 VITALS — BP 118/78 | HR 68 | Temp 97.9°F | Resp 16 | Wt 196.0 lb

## 2016-09-08 DIAGNOSIS — I10 Essential (primary) hypertension: Secondary | ICD-10-CM | POA: Diagnosis not present

## 2016-09-08 NOTE — Progress Notes (Signed)
Patient: Wayne Holland Male    DOB: 1955-11-12   61 y.o.   MRN: [id number] Visit Date: 09/08/2016  Today's Provider: Vernie Murders, PA   Chief Complaint  Patient presents with  . Hypertension   Subjective:    HPI      Hypertension, follow-up:  BP Readings from Last 3 Encounters:  09/08/16 118/78  08/11/16 (!) 138/98  06/28/16 (!) 153/95    He was last seen for hypertension 4 weeks ago.  BP at that visit was 138/98. Management since that visit includes adding Lisinopril 5 mg. He reports excellent compliance with treatment. He is not having side effects.  He is exercising. Walks 2-3 times per week for 30-45 minutes. He is adherent to low salt diet.   Outside blood pressures are about 130's/70's-80's. He is experiencing none.  Patient denies chest pain, chest pressure/discomfort, claudication, dyspnea, exertional chest pressure/discomfort, fatigue, irregular heart beat, lower extremity edema, near-syncope, orthopnea, palpitations and syncope.   Cardiovascular risk factors include advanced age (older than 57 for men, 42 for women), dyslipidemia, hypertension and male gender.       Weight trend: stable Wt Readings from Last 3 Encounters:  09/08/16 196 lb (88.9 kg)  08/11/16 196 lb (88.9 kg)  06/28/16 189 lb (85.7 kg)    Current diet: in general, a "healthy" diet    ------------------------------------------------------------------------ Patient Active Problem List   Diagnosis Date Noted  . Essential hypertension 08/11/2016  . Encounter for screening colonoscopy 05/24/2016  . Pure hypercholesterolemia 03/19/2015  . BPH (benign prostatic hyperplasia) 03/19/2015  . Obstructive sleep apnea 03/19/2015  . Snoring 03/19/2015  . Tendinitis of right shoulder 03/19/2015  . Incomplete bladder emptying 06/10/2012  . Benign prostatic hyperplasia with urinary obstruction 06/10/2012   Past Surgical History:  Procedure Laterality Date  . COLONOSCOPY  2007  .  COLONOSCOPY WITH PROPOFOL N/A 06/28/2016   Procedure: COLONOSCOPY WITH PROPOFOL;  Surgeon: [name], MD;  Location: [facility] ENDOSCOPY;  Service: Endoscopy;  Laterality: N/A;  . NO PAST SURGERIES     Family History  Problem Relation Age of Onset  . Dementia Mother   . Ovarian cancer Mother   . Stroke Father   . Diabetes Father   . Colon cancer Neg Hx     No Known Allergies  Current Outpatient Prescriptions:  .  lisinopril (PRINIVIL,ZESTRIL) 5 MG tablet, Take 1 tablet (5 mg total) by mouth daily., Disp: 30 tablet, Rfl: 3 .  Omega 3 1200 MG CAPS, Take 2 capsules by mouth daily., Disp: , Rfl:  .  pravastatin (PRAVACHOL) 20 MG tablet, TAKE ONE TABLET BY MOUTH ONE TIME DAILY, Disp: 30 tablet, Rfl: 6  Review of Systems  Constitutional: Negative for activity change, appetite change, chills, diaphoresis, fatigue, fever and unexpected weight change.  Respiratory: Negative for shortness of breath.   Cardiovascular: Negative for chest pain, palpitations and leg swelling.    Social History  Substance Use Topics  . Smoking status: Former Smoker    Years: 4.00  . Smokeless tobacco: Never Used     Comment: QUIT IN 1970'S  . Alcohol use 0.0 oz/week     Comment: OCCASIONALLY   Objective:   BP 118/78 (BP Location: Right Arm, Patient Position: Sitting, Cuff Size: Large)   Pulse 68   Temp 97.9 F (36.6 C) (Oral)   Resp 16   Wt 196 lb (88.9 kg)   BMI 27.34 kg/m  Vitals:   09/08/16 0809  BP: 118/78  Pulse: 68  Resp: 16  Temp: 97.9 F (36.6 C)  TempSrc: Oral  Weight: 196 lb (88.9 kg)     Physical Exam  Constitutional: He is oriented to person, place, and time. He appears well-developed and well-nourished. No distress.  HENT:  Head: Normocephalic and atraumatic.  Right Ear: Hearing normal.  Left Ear: Hearing normal.  Nose: Nose normal.  Eyes: Conjunctivae and lids are normal. Right eye exhibits no discharge. Left eye exhibits no discharge. No scleral icterus.    Cardiovascular: Normal rate and regular rhythm.   Pulmonary/Chest: Effort normal and breath sounds normal. No respiratory distress.  Musculoskeletal: Normal range of motion.  Neurological: He is alert and oriented to person, place, and time.  Skin: Skin is intact. No lesion and no rash noted.  Psychiatric: He has a normal mood and affect. His speech is normal and behavior is normal. Thought content normal.      Assessment & Plan:     1. Essential hypertension Well controlled with the addition of ACEI to HCTZ. No side effects and tolerating it well. Continue present dosage and recheck in 3 months.     Patient seen and examined by Vernie Murders, PA, and note scribed by Renaldo Fiddler, CMA.  Vernie Murders, PA  [facility]

## 2016-11-20 ENCOUNTER — Other Ambulatory Visit: Payer: Self-pay | Admitting: Family Medicine

## 2016-11-20 DIAGNOSIS — E7800 Pure hypercholesterolemia, unspecified: Secondary | ICD-10-CM

## 2016-12-07 ENCOUNTER — Other Ambulatory Visit: Payer: Self-pay | Admitting: Family Medicine

## 2016-12-07 DIAGNOSIS — I10 Essential (primary) hypertension: Secondary | ICD-10-CM

## 2016-12-08 ENCOUNTER — Ambulatory Visit (INDEPENDENT_AMBULATORY_CARE_PROVIDER_SITE_OTHER): Payer: PRIVATE HEALTH INSURANCE | Admitting: Family Medicine

## 2016-12-08 ENCOUNTER — Encounter: Payer: Self-pay | Admitting: Family Medicine

## 2016-12-08 VITALS — BP 114/70 | HR 58 | Temp 97.8°F | Wt 190.4 lb

## 2016-12-08 DIAGNOSIS — I10 Essential (primary) hypertension: Secondary | ICD-10-CM | POA: Diagnosis not present

## 2016-12-08 DIAGNOSIS — G4733 Obstructive sleep apnea (adult) (pediatric): Secondary | ICD-10-CM | POA: Diagnosis not present

## 2016-12-08 DIAGNOSIS — E7800 Pure hypercholesterolemia, unspecified: Secondary | ICD-10-CM | POA: Diagnosis not present

## 2016-12-08 NOTE — Progress Notes (Signed)
Patient: Wayne Holland Male    DOB: [birth year]   61 y.o.   MRN: [id number] Visit Date: 12/08/2016  Today's Provider: Vernie Murders, PA   Chief Complaint  Patient presents with  . Hypertension  . Hyperlipidemia  . Follow-up   Subjective:    HPI  Hypertension, follow-up:  BP Readings from Last 3 Encounters:  12/08/16 114/70  09/08/16 118/78  08/11/16 (!) 138/98    He was last seen for hypertension 3 months ago.  BP at that visit was 118/78. Management since that visit includes continue medication.He reports good compliance with treatment. He is not having side effects.  He is exercising. He is adherent to low salt diet.   Outside blood pressures are being checked. He is experiencing none.  Patient denies chest pain, chest pressure/discomfort, irregular heart beat and palpitations.   Cardiovascular risk factors include advanced age (older than 89 for men, 71 for women), dyslipidemia, hypertension and male gender.  Use of agents associated with hypertension: none.   ------------------------------------------------------------------------    Lipid/Cholesterol, Follow-up:   Last seen for this 3 months ago.  Management since that visit includes continue medications.  Last Lipid Panel:    Component Value Date/Time   CHOL 171 05/04/2016 0000   TRIG 91 05/04/2016 0000   HDL 53 05/04/2016 0000   CHOLHDL 3.2 05/04/2016 0000   LDLCALC 100 (H) 05/04/2016 0000    He reports good compliance with treatment. He is not having side effects.   Wt Readings from Last 3 Encounters:  12/08/16 190 lb 6.4 oz (86.4 kg)  09/08/16 196 lb (88.9 kg)  08/11/16 196 lb (88.9 kg)    ------------------------------------------------------------------------ Patient Active Problem List   Diagnosis Date Noted  . Essential hypertension 08/11/2016  . Encounter for screening colonoscopy 05/24/2016  . Pure hypercholesterolemia 03/19/2015  . BPH (benign prostatic hyperplasia)  03/19/2015  . Obstructive sleep apnea 03/19/2015  . Snoring 03/19/2015  . Tendinitis of right shoulder 03/19/2015  . Incomplete bladder emptying 06/10/2012  . Benign prostatic hyperplasia with urinary obstruction 06/10/2012   Past Surgical History:  Procedure Laterality Date  . COLONOSCOPY  2007  . COLONOSCOPY WITH PROPOFOL N/A 06/28/2016   Procedure: COLONOSCOPY WITH PROPOFOL;  Surgeon: [name], MD;  [facility];  Service: Endoscopy;  Laterality: N/A;  . NO PAST SURGERIES     Family History  Problem Relation Age of Onset  . Dementia Mother   . Ovarian cancer Mother   . Stroke Father   . Diabetes Father   . Colon cancer Neg Hx    No Known Allergies   Previous Medications   LISINOPRIL (PRINIVIL,ZESTRIL) 5 MG TABLET    TAKE 1 TABLET (5 MG TOTAL) BY MOUTH DAILY.   OMEGA 3 1200 MG CAPS    Take 2 capsules by mouth daily.   PRAVASTATIN (PRAVACHOL) 20 MG TABLET    TAKE ONE TABLET BY MOUTH ONE TIME DAILY    Review of Systems  Constitutional: Negative.   Respiratory: Negative.   Cardiovascular: Negative.   Musculoskeletal: Negative.     Social History  Substance Use Topics  . Smoking status: Former Smoker    Years: 4.00  . Smokeless tobacco: Never Used     Comment: QUIT IN 1970'S  . Alcohol use 0.0 oz/week     Comment: OCCASIONALLY   Objective:   BP 114/70 (BP Location: Right Arm, Patient Position: Sitting, Cuff Size: Normal)   Pulse (!) 58   Temp 97.8 F (36.6 C) (Oral)  Wt 190 lb 6.4 oz (86.4 kg)   SpO2 96%   BMI 26.56 kg/m   Physical Exam  Constitutional: He is oriented to person, place, and time. He appears well-developed and well-nourished. No distress.  HENT:  Head: Normocephalic and atraumatic.  Right Ear: Hearing normal.  Left Ear: Hearing normal.  Nose: Nose normal.  Eyes: Conjunctivae and lids are normal. Right eye exhibits no discharge. Left eye exhibits no discharge. No scleral icterus.  Neck: Neck supple.  Cardiovascular:  Normal rate and regular rhythm.   Pulmonary/Chest: Effort normal and breath sounds normal. No respiratory distress.  Abdominal: Soft. Bowel sounds are normal.  Musculoskeletal: Normal range of motion.  Neurological: He is alert and oriented to person, place, and time.  Skin: Skin is intact. No lesion and no rash noted.  Psychiatric: He has a normal mood and affect. His speech is normal and behavior is normal. Thought content normal.      Assessment & Plan:     1. Essential hypertension Well controlled BP and stable on Lisinopril 5 mg qd. Recheck CBC, Lipid panel and CMP. Continue exercise and sodium restriction (has lost 6 lbs since November 2017). Recheck pending reports. - CBC with Differential/Platelet - Comprehensive metabolic panel - Lipid panel  2. Pure hypercholesterolemia Tolerating Pravastatin 20 mg qd with Omega-3 1200 mg 2 caps daily without side effects. Recheck CMP and lipid level. Continue low fat diet and regular exercise. Lab Results  Component Value Date   CHOL 171 05/04/2016   HDL 53 05/04/2016   LDLCALC 100 (H) 05/04/2016   TRIG 91 05/04/2016   CHOLHDL 3.2 05/04/2016    - Comprehensive metabolic panel - Lipid panel  3. Obstructive sleep apnea Still using CPAP at 8 cm H2O each night. Good energy level and well controlled hypertension. Recheck routine labs. - CBC with Differential/Platelet - Comprehensive metabolic panel

## 2016-12-09 LAB — CBC WITH DIFFERENTIAL/PLATELET
Basophils Absolute: 0 10*3/uL (ref 0.0–0.2)
Basos: 0 %
EOS (ABSOLUTE): 0.1 10*3/uL (ref 0.0–0.4)
Eos: 2 %
Hematocrit: 46.4 % (ref 37.5–51.0)
Hemoglobin: 16.1 g/dL (ref 13.0–17.7)
IMMATURE GRANULOCYTES: 0 %
Immature Grans (Abs): 0 10*3/uL (ref 0.0–0.1)
Lymphocytes Absolute: 2 10*3/uL (ref 0.7–3.1)
Lymphs: 38 %
MCH: 31.4 pg (ref 26.6–33.0)
MCHC: 34.7 g/dL (ref 31.5–35.7)
MCV: 90 fL (ref 79–97)
MONOS ABS: 0.5 10*3/uL (ref 0.1–0.9)
Monocytes: 10 %
NEUTROS PCT: 50 %
Neutrophils Absolute: 2.6 10*3/uL (ref 1.4–7.0)
PLATELETS: 255 10*3/uL (ref 150–379)
RBC: 5.13 x10E6/uL (ref 4.14–5.80)
RDW: 13.7 % (ref 12.3–15.4)
WBC: 5.3 10*3/uL (ref 3.4–10.8)

## 2016-12-09 LAB — COMPREHENSIVE METABOLIC PANEL
ALK PHOS: 65 IU/L (ref 39–117)
ALT: 35 IU/L (ref 0–44)
AST: 23 IU/L (ref 0–40)
Albumin/Globulin Ratio: 2 (ref 1.2–2.2)
Albumin: 4.9 g/dL — ABNORMAL HIGH (ref 3.6–4.8)
BUN/Creatinine Ratio: 16 (ref 10–24)
BUN: 16 mg/dL (ref 8–27)
Bilirubin Total: 0.8 mg/dL (ref 0.0–1.2)
CALCIUM: 9.7 mg/dL (ref 8.6–10.2)
CO2: 25 mmol/L (ref 20–29)
CREATININE: 0.99 mg/dL (ref 0.76–1.27)
Chloride: 99 mmol/L (ref 96–106)
GFR calc Af Amer: 95 mL/min/{1.73_m2} (ref >59)
GFR, EST NON AFRICAN AMERICAN: 82 mL/min/{1.73_m2} (ref >59)
Globulin, Total: 2.5 g/dL (ref 1.5–4.5)
Glucose: 107 mg/dL — ABNORMAL HIGH (ref 65–99)
POTASSIUM: 5 mmol/L (ref 3.5–5.2)
Sodium: 138 mmol/L (ref 134–144)
Total Protein: 7.4 g/dL (ref 6.0–8.5)

## 2016-12-09 LAB — LIPID PANEL
CHOL/HDL RATIO: 2.8 ratio (ref 0.0–5.0)
CHOLESTEROL TOTAL: 166 mg/dL (ref 100–199)
HDL: 59 mg/dL (ref >39)
LDL Calculated: 96 mg/dL (ref 0–99)
TRIGLYCERIDES: 57 mg/dL (ref 0–149)
VLDL Cholesterol Cal: 11 mg/dL (ref 5–40)

## 2016-12-11 NOTE — Progress Notes (Signed)
Advised  ED 

## 2017-01-18 ENCOUNTER — Other Ambulatory Visit: Payer: Self-pay | Admitting: Family Medicine

## 2017-01-18 ENCOUNTER — Telehealth: Payer: Self-pay | Admitting: Family Medicine

## 2017-01-18 DIAGNOSIS — I10 Essential (primary) hypertension: Secondary | ICD-10-CM

## 2017-01-18 MED ORDER — LISINOPRIL 5 MG PO TABS: 5.0000 mg | ORAL_TABLET | Freq: Every day | ORAL | 3 refills | Status: DC

## 2017-01-18 NOTE — Telephone Encounter (Signed)
[facility] pharmacy faxed a request for a 90-days supply for the following medication. Thanks CC  lisinopril (PRINIVIL,ZESTRIL) 5 MG tablet  >Take 1 tablet ( 5 MG total ) by mouth daily.

## 2017-05-10 ENCOUNTER — Encounter: Payer: Self-pay | Admitting: Family Medicine

## 2017-05-10 ENCOUNTER — Ambulatory Visit (INDEPENDENT_AMBULATORY_CARE_PROVIDER_SITE_OTHER): Payer: PRIVATE HEALTH INSURANCE | Admitting: Family Medicine

## 2017-05-10 VITALS — BP 112/80 | HR 63 | Temp 98.0°F | Resp 16 | Ht 71.0 in | Wt 192.2 lb

## 2017-05-10 DIAGNOSIS — N401 Enlarged prostate with lower urinary tract symptoms: Secondary | ICD-10-CM | POA: Diagnosis not present

## 2017-05-10 DIAGNOSIS — G4733 Obstructive sleep apnea (adult) (pediatric): Secondary | ICD-10-CM | POA: Diagnosis not present

## 2017-05-10 DIAGNOSIS — N138 Other obstructive and reflux uropathy: Secondary | ICD-10-CM

## 2017-05-10 DIAGNOSIS — Z23 Encounter for immunization: Secondary | ICD-10-CM

## 2017-05-10 DIAGNOSIS — E7800 Pure hypercholesterolemia, unspecified: Secondary | ICD-10-CM

## 2017-05-10 DIAGNOSIS — Z0000 Encounter for general adult medical examination without abnormal findings: Secondary | ICD-10-CM | POA: Diagnosis not present

## 2017-05-10 DIAGNOSIS — I10 Essential (primary) hypertension: Secondary | ICD-10-CM

## 2017-05-10 LAB — CBC WITH DIFFERENTIAL/PLATELET
BASOS PCT: 0.6 %
Basophils Absolute: 31 cells/uL (ref 0–200)
EOS PCT: 1.3 %
Eosinophils Absolute: 68 cells/uL (ref 15–500)
HEMATOCRIT: 45.9 % (ref 38.5–50.0)
HEMOGLOBIN: 16.2 g/dL (ref 13.2–17.1)
LYMPHS ABS: 1836 {cells}/uL (ref 850–3900)
MCH: 31 pg (ref 27.0–33.0)
MCHC: 35.3 g/dL (ref 32.0–36.0)
MCV: 87.8 fL (ref 80.0–100.0)
MPV: 9.6 fL (ref 7.5–12.5)
Monocytes Relative: 10.7 %
NEUTROS ABS: 2709 {cells}/uL (ref 1500–7800)
NEUTROS PCT: 52.1 %
Platelets: 268 10*3/uL (ref 140–400)
RBC: 5.23 10*6/uL (ref 4.20–5.80)
RDW: 12.2 % (ref 11.0–15.0)
Total Lymphocyte: 35.3 %
WBC mixed population: 556 cells/uL (ref 200–950)
WBC: 5.2 10*3/uL (ref 3.8–10.8)

## 2017-05-10 LAB — LIPID PANEL
Cholesterol: 165 mg/dL (ref <200)
HDL: 62 mg/dL (ref >40)
LDL Cholesterol (Calc): 89 mg/dL (calc)
NON-HDL CHOLESTEROL (CALC): 103 mg/dL (ref <130)
Total CHOL/HDL Ratio: 2.7 (calc) (ref <5.0)
Triglycerides: 48 mg/dL (ref <150)

## 2017-05-10 LAB — COMPLETE METABOLIC PANEL WITH GFR
AG Ratio: 1.9 (calc) (ref 1.0–2.5)
ALBUMIN MSPROF: 4.6 g/dL (ref 3.6–5.1)
ALKALINE PHOSPHATASE (APISO): 55 U/L (ref 40–115)
ALT: 31 U/L (ref 9–46)
AST: 19 U/L (ref 10–35)
BUN: 12 mg/dL (ref 7–25)
CHLORIDE: 103 mmol/L (ref 98–110)
CO2: 28 mmol/L (ref 20–32)
Calcium: 9.3 mg/dL (ref 8.6–10.3)
Creat: 0.91 mg/dL (ref 0.70–1.25)
GFR, EST AFRICAN AMERICAN: 105 mL/min/{1.73_m2} (ref >=60)
GFR, EST NON AFRICAN AMERICAN: 91 mL/min/{1.73_m2} (ref >=60)
GLUCOSE: 105 mg/dL — AB (ref 65–99)
Globulin: 2.4 g/dL (calc) (ref 1.9–3.7)
POTASSIUM: 4.9 mmol/L (ref 3.5–5.3)
SODIUM: 138 mmol/L (ref 135–146)
TOTAL PROTEIN: 7 g/dL (ref 6.1–8.1)
Total Bilirubin: 0.8 mg/dL (ref 0.2–1.2)

## 2017-05-10 LAB — TSH: TSH: 0.3 mIU/L — ABNORMAL LOW (ref 0.40–4.50)

## 2017-05-10 NOTE — Progress Notes (Signed)
Patient: Wayne Holland, Male    DOB: March 16, 1956, 61 y.o.   MRN: [id number] Visit Date: 05/10/2017  Today's Provider: Vernie Murders, PA   Chief Complaint  Patient presents with  . Annual Exam   Subjective:    Annual physical exam Wayne Holland is a 61 y.o. male who presents today for health maintenance and complete physical. He feels well. He reports exercising 3 days per week. He reports he is sleeping fairly well.  ----------------------------------------------------------------- Last CPE- 05/04/2016 Colonoscopy- 06/28/2016 Tetanus- 04/07/2011  Review of Systems  Constitutional: Negative.   HENT: Negative.   Eyes: Negative.   Respiratory: Negative.   Cardiovascular: Negative.   Gastrointestinal: Negative.   Endocrine: Negative.   Genitourinary: Negative.   Musculoskeletal: Negative.   Skin: Negative.   Allergic/Immunologic: Negative.   Neurological: Negative.   Hematological: Negative.   Psychiatric/Behavioral: Negative.     Social History      He  reports that he has quit smoking. He quit after 4.00 years of use. he has never used smokeless tobacco. He reports that he drinks alcohol. He reports that he does not use drugs.       Social History   Socioeconomic History  . Marital status: Married    Spouse name: None  . Number of children: None  . Years of education: None  . Highest education level: None  Social Needs  . Financial resource strain: None  . Food insecurity - worry: None  . Food insecurity - inability: None  . Transportation needs - medical: None  . Transportation needs - non-medical: None  Occupational History  . None  Tobacco Use  . Smoking status: Former Smoker    Years: 4.00  . Smokeless tobacco: Never Used  . Tobacco comment: QUIT IN 1970'S  Substance and Sexual Activity  . Alcohol use: Yes    Alcohol/week: 0.0 oz    Comment: OCCASIONALLY  . Drug use: No  . Sexual activity: None  Other Topics Concern  . None  Social  History Narrative  . None    Past Medical History:  Diagnosis Date  . Sleep apnea     Patient Active Problem List   Diagnosis Date Noted  . Essential hypertension 08/11/2016  . Encounter for screening colonoscopy 05/24/2016  . Pure hypercholesterolemia 03/19/2015  . BPH (benign prostatic hyperplasia) 03/19/2015  . Obstructive sleep apnea 03/19/2015  . Snoring 03/19/2015  . Tendinitis of right shoulder 03/19/2015  . Incomplete bladder emptying 06/10/2012  . Benign prostatic hyperplasia with urinary obstruction 06/10/2012    Past Surgical History:  Procedure Laterality Date  . COLONOSCOPY  2007  . COLONOSCOPY WITH PROPOFOL N/A 06/28/2016   Procedure: COLONOSCOPY WITH PROPOFOL;  Surgeon: [name], MD;  Location: [facility] ENDOSCOPY;  Service: Endoscopy;  Laterality: N/A;  . NO PAST SURGERIES      Family History        Family Status  Relation Name Status  . Mother  Deceased at age 21  . Father  Deceased at age 27  . Neg Hx  (Not Specified)        His family history includes Dementia in his mother; Diabetes in his father; Ovarian cancer in his mother; Stroke in his father.     No Known Allergies   Current Outpatient Medications:  .  lisinopril (PRINIVIL,ZESTRIL) 5 MG tablet, Take 1 tablet (5 mg total) by mouth daily., Disp: 90 tablet, Rfl: 3 .  Omega 3 1200 MG CAPS, Take 2 capsules by  mouth daily., Disp: , Rfl:  .  pravastatin (PRAVACHOL) 20 MG tablet, TAKE ONE TABLET BY MOUTH ONE TIME DAILY, Disp: 30 tablet, Rfl: 6    Objective:   Vitals: BP 112/80 (BP Location: Right Arm, Patient Position: Sitting, Cuff Size: Normal)   Pulse 63   Temp 98 F (36.7 C) (Oral)   Resp 16   Ht 5\' 11"  (1.803 m)   Wt 192 lb 3.2 oz (87.2 kg)   SpO2 99%   BMI 26.81 kg/m     Physical Exam  Constitutional: He is oriented to person, place, and time. He appears well-developed and well-nourished. No distress.  HENT:  Head: Normocephalic and atraumatic.  Right Ear: Hearing and  external ear normal.  Left Ear: Hearing and external ear normal.  Nose: Nose normal.  Mouth/Throat: Oropharynx is clear and moist.  Eyes: Conjunctivae and lids are normal. Right eye exhibits no discharge. Left eye exhibits no discharge. No scleral icterus.  Neck: Neck supple.  Cardiovascular: Normal rate and regular rhythm.  Pulmonary/Chest: Effort normal and breath sounds normal. No respiratory distress.  Abdominal: Soft. Bowel sounds are normal.  Musculoskeletal: Normal range of motion.  Neurological: He is alert and oriented to person, place, and time.  Skin: Skin is intact. No lesion and no rash noted.  Psychiatric: He has a normal mood and affect. His speech is normal and behavior is normal. Thought content normal.   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      Immunization History  Administered Date(s) Administered  . Influenza Split 04/09/2012  . Influenza,inj,Quad PF,6+ Mos 04/17/2013, 04/24/2014, 04/26/2015, 05/04/2016, 05/10/2017  . Tdap 04/07/2011  . Zoster 05/04/2016    Health Maintenance  Topic Date Due  . Samul Dada  04/06/2021  . COLONOSCOPY  06/28/2026  . INFLUENZA VACCINE  Completed  . Hepatitis C Screening  Completed  . HIV Screening  Completed     Discussed health benefits of physical activity, and encouraged him to engage in regular exercise appropriate for his age and condition.    --------------------------------------------------------------------  1. Annual physical exam Good general health. Immunizations up to date except flu shot (given today). Given anticipatory counseling.  2. Essential hypertension Well controlled without chest pains, palpitation or dyspnea. No peripheral edema.  Tolerated Lisinopril 5 mg qd. Recheck labs. - COMPLETE METABOLIC PANEL WITH GFR - Lipid panel - CBC with Differential/Platelet - TSH  3. Benign prostatic hyperplasia with urinary obstruction Followed by Dr. Jacqlyn Larsen (urologist). Schedule for recheck next week with PSA. Denies new changes in decreased stream and delay of micturition.  4. Pure hypercholesterolemia Tolerating Pravastatin 20 mg qd without side effects. Continue low fat diet and exercise. Recheck labs and follow up pending reports. - COMPLETE METABOLIC PANEL WITH GFR - Lipid panel - TSH  5. Obstructive sleep apnea Still using CPAP at 8 cm H2O by nasal pillows. Considering getting a mouth appliance to replace CPAP.  6. Need for influenza vaccination - Flu Vaccine QUAD 6+ mos PF IM (Fluarix Quad PF)   Vernie Murders, PA  [facility]

## 2017-06-09 ENCOUNTER — Other Ambulatory Visit: Payer: Self-pay | Admitting: Family Medicine

## 2017-06-09 DIAGNOSIS — E7800 Pure hypercholesterolemia, unspecified: Secondary | ICD-10-CM

## 2017-07-06 ENCOUNTER — Encounter: Payer: Self-pay | Admitting: Family Medicine

## 2017-07-09 ENCOUNTER — Other Ambulatory Visit: Payer: Self-pay | Admitting: Family Medicine

## 2017-07-09 ENCOUNTER — Telehealth: Payer: Self-pay

## 2017-07-09 DIAGNOSIS — R7989 Other specified abnormal findings of blood chemistry: Secondary | ICD-10-CM

## 2017-07-09 NOTE — Telephone Encounter (Signed)
Thyroid panel future order placed in chart for print out when he comes by to get labs done.

## 2017-07-09 NOTE — Telephone Encounter (Signed)
Pt is due to have his thyroid rechecked.  Please call when ready.   Contact:  [phone]    Thanks,   -Mickel Baas

## 2017-07-20 ENCOUNTER — Other Ambulatory Visit: Payer: Self-pay | Admitting: Family Medicine

## 2017-07-21 LAB — THYROID PANEL WITH TSH
Free Thyroxine Index: 1.9 (ref 1.2–4.9)
T3 Uptake Ratio: 27 % (ref 24–39)
T4, Total: 7.1 ug/dL (ref 4.5–12.0)
TSH: 0.365 u[IU]/mL — AB (ref 0.450–4.500)

## 2017-07-23 ENCOUNTER — Telehealth: Payer: Self-pay

## 2017-07-23 NOTE — Telephone Encounter (Signed)
Pt advised.   Thanks,   -Laura  

## 2017-07-23 NOTE — Telephone Encounter (Signed)
-----   Message from Margo Common, [place] sent at 07/22/2017  4:55 PM EST ----- Normal T4 and T3 with TSH slowly coming back toward normal range. No additional medications needed. Will recheck levels annually.

## 2017-12-30 ENCOUNTER — Other Ambulatory Visit: Payer: Self-pay | Admitting: Family Medicine

## 2017-12-30 DIAGNOSIS — E7800 Pure hypercholesterolemia, unspecified: Secondary | ICD-10-CM

## 2018-01-16 ENCOUNTER — Encounter: Payer: Self-pay | Admitting: Urology

## 2018-01-16 ENCOUNTER — Ambulatory Visit: Payer: PRIVATE HEALTH INSURANCE | Admitting: Urology

## 2018-01-16 VITALS — BP 112/71 | HR 72 | Ht 71.0 in | Wt 188.7 lb

## 2018-01-16 DIAGNOSIS — N401 Enlarged prostate with lower urinary tract symptoms: Secondary | ICD-10-CM

## 2018-01-16 DIAGNOSIS — N138 Other obstructive and reflux uropathy: Secondary | ICD-10-CM

## 2018-01-16 LAB — BLADDER SCAN AMB NON-IMAGING

## 2018-01-16 NOTE — Progress Notes (Signed)
01/16/2018 2:55 PM   Wayne Holland Sep 11, 1955 [id number]  Referring provider: Margo Common, [place],  [address]  Chief Complaint  Patient presents with  . Establish Care   Urologic problems: 1.  BPH with lower urinary tract symptoms -Previously followed by Dr. Jacqlyn Larsen -Cystoscopy January 2019 with lateral lobe enlargement and bilobar intravesical protrusion -Tamsulosin started January 3136  HPI: 62 year old male presents to establish local urologic care.  He was previously seen by Dr. Jacqlyn Larsen and last saw him at [facility] in January 2019.  Clinical summary as above.  He was started on tamsulosin and has noted improvement in his voiding pattern.  He is currently satisfied and does not desire any additional/alternative therapies. Last PSA November 2018 was 1.66  PMH: Past Medical History:  Diagnosis Date  . Sleep apnea     Surgical History: Past Surgical History:  Procedure Laterality Date  . COLONOSCOPY  2007  . COLONOSCOPY WITH PROPOFOL N/A 06/28/2016   Procedure: COLONOSCOPY WITH PROPOFOL;  Surgeon: [name], MD;  Location: [facility] ENDOSCOPY;  Service: Endoscopy;  Laterality: N/A;  . NO PAST SURGERIES      Home Medications:  Allergies as of 01/16/2018   No Known Allergies     Medication List        Accurate as of 01/16/18  2:55 PM. Always use your most recent med list.          lisinopril 5 MG tablet Commonly known as:  PRINIVIL,ZESTRIL Take 1 tablet (5 mg total) by mouth daily.   Omega 3 1200 MG Caps Take 2 capsules by mouth daily.   pravastatin 20 MG tablet Commonly known as:  PRAVACHOL TAKE ONE TABLET BY MOUTH ONE TIME DAILY   tamsulosin 0.4 MG Caps capsule Commonly known as:  FLOMAX TAKE 1 CAPSULE (0.4 MG TOTAL) BY MOUTH DAILY WITH BREAKFAST.       Allergies: No Known Allergies  Family History: Family History  Problem Relation Age of Onset  . Dementia Mother   . Ovarian cancer Mother   . Stroke Father   .  Diabetes Father   . Colon cancer Neg Hx     Social History:  reports that he has quit smoking. He quit after 4.00 years of use. He has never used smokeless tobacco. He reports that he drinks alcohol. He reports that he does not use drugs.  ROS: UROLOGY Frequent Urination?: No Hard to postpone urination?: No Burning/pain with urination?: No Get up at night to urinate?: Yes Leakage of urine?: No Urine stream starts and stops?: Yes Trouble starting stream?: No Do you have to strain to urinate?: No Blood in urine?: No Urinary tract infection?: No Sexually transmitted disease?: No Injury to kidneys or bladder?: No Painful intercourse?: No Weak stream?: No Erection problems?: No Penile pain?: No  Gastrointestinal Nausea?: No Vomiting?: No Indigestion/heartburn?: No Diarrhea?: No Constipation?: No  Constitutional Fever: No Night sweats?: No Weight loss?: No Fatigue?: No  Skin Skin rash/lesions?: No Itching?: No  Eyes Blurred vision?: No Double vision?: No  Ears/Nose/Throat Sore throat?: No Sinus problems?: No  Hematologic/Lymphatic Swollen glands?: No Easy bruising?: No  Cardiovascular Leg swelling?: No Chest pain?: No  Respiratory Cough?: No Shortness of breath?: No  Endocrine Excessive thirst?: No  Musculoskeletal Back pain?: No Joint pain?: No  Neurological Headaches?: No Dizziness?: No  Psychologic Depression?: No Anxiety?: No  Physical Exam: BP 112/71 (BP Location: Left Arm, Patient Position: Sitting, Cuff Size: Normal)   Pulse 72  Ht 5\' 11"  (1.803 m)   Wt 188 lb 11.2 oz (85.6 kg)   BMI 26.32 kg/m   Constitutional:  Alert and oriented, No acute distress. HEENT: [place] AT, moist mucus membranes.  Trachea midline, no masses. Cardiovascular: No clubbing, cyanosis, or edema. Respiratory: Normal respiratory effort, no increased work of breathing. GI: Abdomen is soft, nontender, nondistended, no abdominal masses GU: No CVA  tenderness Lymph: No cervical or inguinal lymphadenopathy. Skin: No rashes, bruises or suspicious lesions. Neurologic: Grossly intact, no focal deficits, moving all 4 extremities. Psychiatric: Normal mood and affect.   Assessment & Plan:   62 year old male with stable lower urinary tract symptoms.  Doing well on tamsulosin which was refilled.  Follow-up annually or as needed for any significant change in his voiding pattern. PVR by bladder scan today was 70 mL.   Abbie Sons, [place] [address] [place],  [address] [phone]

## 2018-01-17 ENCOUNTER — Encounter: Payer: Self-pay | Admitting: Urology

## 2018-01-31 ENCOUNTER — Other Ambulatory Visit: Payer: Self-pay | Admitting: Family Medicine

## 2018-01-31 DIAGNOSIS — I10 Essential (primary) hypertension: Secondary | ICD-10-CM

## 2018-03-11 ENCOUNTER — Ambulatory Visit: Payer: PRIVATE HEALTH INSURANCE | Admitting: Family Medicine

## 2018-03-11 ENCOUNTER — Encounter: Payer: Self-pay | Admitting: Family Medicine

## 2018-03-11 VITALS — BP 110/70 | HR 70 | Temp 98.1°F | Resp 16 | Wt 188.0 lb

## 2018-03-11 DIAGNOSIS — L02231 Carbuncle of abdominal wall: Secondary | ICD-10-CM | POA: Diagnosis not present

## 2018-03-11 MED ORDER — DOXYCYCLINE HYCLATE 100 MG PO TABS: 100.0000 mg | ORAL_TABLET | Freq: Two times a day (BID) | ORAL | 0 refills | Status: DC

## 2018-03-11 NOTE — Progress Notes (Signed)
  Subjective:     Patient ID: Wayne Holland, male   DOB: March 30, 1956, 62 y.o.   MRN: [id number] Chief Complaint  Patient presents with  . Rash   HPI States he developed a red painful area in his abdomen on or about 9/12. Reports it looked like a red bump at first. No hx of insect bite or MRSA but reports his grandson had some type of infection on his back which resolved with abx.  Review of Systems     Objective:   Physical Exam  Constitutional: He appears well-developed and well-nourished. No distress.  Skin:  Mid lower abdomen with raised, tender, erythematous area with central area of clearing. There is underlying induration. No pointing or drainage.       Assessment:    1. Carbuncle of abdominal wall: doxycycline    Plan:    Discussed use of frequent warm compresses. Will return for non-improvement or pointing without spontaneous drainage.

## 2018-03-11 NOTE — Patient Instructions (Signed)
Discussed use of warm, wet compresses several x day. If not improving or you see a pus appearing head come in for further treatment.

## 2018-03-19 ENCOUNTER — Ambulatory Visit: Payer: PRIVATE HEALTH INSURANCE | Admitting: Family Medicine

## 2018-03-19 ENCOUNTER — Encounter: Payer: Self-pay | Admitting: Family Medicine

## 2018-03-19 VITALS — BP 130/70 | Temp 98.3°F | Resp 16 | Wt 190.0 lb

## 2018-03-19 DIAGNOSIS — L089 Local infection of the skin and subcutaneous tissue, unspecified: Secondary | ICD-10-CM

## 2018-03-19 DIAGNOSIS — L723 Sebaceous cyst: Secondary | ICD-10-CM | POA: Diagnosis not present

## 2018-03-19 MED ORDER — DOXYCYCLINE HYCLATE 100 MG PO TABS: 100.0000 mg | ORAL_TABLET | Freq: Two times a day (BID) | ORAL | 0 refills | Status: DC

## 2018-03-19 NOTE — Progress Notes (Signed)
Patient: Wayne Holland Male    DOB: 1955/12/08   62 y.o.   MRN: [id number] Visit Date: 03/19/2018  Today's Provider: Vernie Murders, PA   Chief Complaint  Patient presents with  . Follow-up    carbuncle of abdominal wall   Subjective:    HPI  Follow up for carbuncle of abdominal wall  The patient was last seen for this 8 days ago. Changes made at last visit include start Doxycycline.  He reports excellent compliance with treatment. Patient completed treatment on Sunday. He feels that condition is Unchanged. He is not having side effects.   ------------------------------------------------------------------------------------     Past Medical History:  Diagnosis Date  . Sleep apnea    Past Surgical History:  Procedure Laterality Date  . COLONOSCOPY  2007  . COLONOSCOPY WITH PROPOFOL N/A 06/28/2016   Procedure: COLONOSCOPY WITH PROPOFOL;  Surgeon: [name], MD;  Location: [facility] ENDOSCOPY;  Service: Endoscopy;  Laterality: N/A;  . NO PAST SURGERIES     Family History  Problem Relation Age of Onset  . Dementia Mother   . Ovarian cancer Mother   . Stroke Father   . Diabetes Father   . Colon cancer Neg Hx    No Known Allergies  Current Outpatient Medications:  .  lisinopril (PRINIVIL,ZESTRIL) 5 MG tablet, TAKE 1 TABLET BY MOUTH EVERY DAY, Disp: 90 tablet, Rfl: 3 .  Omega 3 1200 MG CAPS, Take 2 capsules by mouth daily., Disp: , Rfl:  .  pravastatin (PRAVACHOL) 20 MG tablet, TAKE ONE TABLET BY MOUTH ONE TIME DAILY, Disp: 90 tablet, Rfl: 2 .  tamsulosin (FLOMAX) 0.4 MG CAPS capsule, TAKE 1 CAPSULE (0.4 MG TOTAL) BY MOUTH DAILY WITH BREAKFAST., Disp: , Rfl: 3  Review of Systems  Constitutional: Negative.   Respiratory: Negative.   Skin: Positive for rash.   Social History   Tobacco Use  . Smoking status: Former Smoker    Years: 4.00  . Smokeless tobacco: Never Used  . Tobacco comment: QUIT IN 1970'S  Substance Use Topics  . Alcohol use: Yes      Alcohol/week: 0.0 standard drinks    Comment: OCCASIONALLY   Objective:   BP 130/70 (BP Location: Right Arm, Patient Position: Sitting, Cuff Size: Normal)   Temp 98.3 F (36.8 C) (Oral)   Resp 16   Wt 190 lb (86.2 kg)   BMI 26.50 kg/m  Vitals:   03/19/18 1453  BP: 130/70  Resp: 16  Temp: 98.3 F (36.8 C)  TempSrc: Oral  Weight: 190 lb (86.2 kg)   Physical Exam  Constitutional: He is oriented to person, place, and time. He appears well-developed and well-nourished. No distress.  HENT:  Head: Normocephalic and atraumatic.  Right Ear: Hearing normal.  Left Ear: Hearing normal.  Nose: Nose normal.  Eyes: Conjunctivae and lids are normal. Right eye exhibits no discharge. Left eye exhibits no discharge. No scleral icterus.  Pulmonary/Chest: Effort normal. No respiratory distress.  Musculoskeletal: Normal range of motion.  Neurological: He is alert and oriented to person, place, and time.  Skin: Skin is intact. No lesion and no rash noted.  3.0 cm area of erythema and soreness with 0.6 cm central white soft fluctuant area. No local lymphadenopathy appreciated.  Psychiatric: He has a normal mood and affect. His speech is normal and behavior is normal. Thought content normal.      Assessment & Plan:     1. Infected sebaceous cyst of skin Still  has some soreness in 3 cm erythematous cystic lesion on upper abdomen at the midline. Anesthetized with Xylocaine with Epinephrine and created a 0.6 cm incision with a #11 blade. Expressed waxy sebum and purulent fluid. Placed Iodoform gauze wick and applied a 2" band-aid. Continue Doxycycline for one week and recheck area in 3 days.  - doxycycline (VIBRA-TABS) 100 MG tablet; Take 1 tablet (100 mg total) by mouth 2 (two) times daily.  Dispense: 14 tablet; Refill: [place], PA  [facility]

## 2018-03-22 ENCOUNTER — Ambulatory Visit: Payer: PRIVATE HEALTH INSURANCE | Admitting: Family Medicine

## 2018-03-22 ENCOUNTER — Encounter: Payer: Self-pay | Admitting: Family Medicine

## 2018-03-22 VITALS — BP 138/80 | HR 60 | Temp 98.5°F | Wt 187.0 lb

## 2018-03-22 DIAGNOSIS — L089 Local infection of the skin and subcutaneous tissue, unspecified: Secondary | ICD-10-CM | POA: Diagnosis not present

## 2018-03-22 DIAGNOSIS — L723 Sebaceous cyst: Secondary | ICD-10-CM | POA: Diagnosis not present

## 2018-03-22 NOTE — Progress Notes (Signed)
Patient: Wayne Holland Male    DOB: 1956/01/26   62 y.o.   MRN: [id number] Visit Date: 03/22/2018  Today's Provider: Vernie Murders, PA   Chief Complaint  Patient presents with  . Cyst    Follow up   Subjective:    HPI  Abscess: Patient presents for evaluation of a cutaneous abscess. Lesion is located in the abdomen. Onset was 2 weeks ago. Symptoms have gradually improved. Abscess has associated symptoms of pain. Patient does not have previous history of cutaneous abscesses. Patient does not have diabetes. Past Medical History:  Diagnosis Date  . Sleep apnea    Past Surgical History:  Procedure Laterality Date  . COLONOSCOPY  2007  . COLONOSCOPY WITH PROPOFOL N/A 06/28/2016   Procedure: COLONOSCOPY WITH PROPOFOL;  Surgeon: [name], MD;  Location: [facility] ENDOSCOPY;  Service: Endoscopy;  Laterality: N/A;  . NO PAST SURGERIES     Family History  Problem Relation Age of Onset  . Dementia Mother   . Ovarian cancer Mother   . Stroke Father   . Diabetes Father   . Colon cancer Neg Hx    No Known Allergies  Current Outpatient Medications:  .  doxycycline (VIBRA-TABS) 100 MG tablet, Take 1 tablet (100 mg total) by mouth 2 (two) times daily., Disp: 14 tablet, Rfl: 0 .  lisinopril (PRINIVIL,ZESTRIL) 5 MG tablet, TAKE 1 TABLET BY MOUTH EVERY DAY, Disp: 90 tablet, Rfl: 3 .  Omega 3 1200 MG CAPS, Take 2 capsules by mouth daily., Disp: , Rfl:  .  pravastatin (PRAVACHOL) 20 MG tablet, TAKE ONE TABLET BY MOUTH ONE TIME DAILY, Disp: 90 tablet, Rfl: 2 .  tamsulosin (FLOMAX) 0.4 MG CAPS capsule, TAKE 1 CAPSULE (0.4 MG TOTAL) BY MOUTH DAILY WITH BREAKFAST., Disp: , Rfl: 3  Review of Systems  Constitutional: Negative.   Skin: Positive for wound. Negative for color change, pallor and rash.  Neurological: Negative for dizziness, light-headedness and headaches.   Social History   Tobacco Use  . Smoking status: Former Smoker    Years: 4.00  . Smokeless tobacco: Never  Used  . Tobacco comment: QUIT IN 1970'S  Substance Use Topics  . Alcohol use: Yes    Alcohol/week: 0.0 standard drinks    Comment: OCCASIONALLY   Objective:   BP 138/80 (BP Location: Right Arm, Patient Position: Sitting, Cuff Size: Normal)   Pulse 60   Temp 98.5 F (36.9 C) (Oral)   Wt 187 lb (84.8 kg)   SpO2 97%   BMI 26.08 kg/m  Vitals:   03/22/18 0805  BP: 138/80  Pulse: 60  Temp: 98.5 F (36.9 C)  TempSrc: Oral  SpO2: 97%  Weight: 187 lb (84.8 kg)   Physical Exam  Constitutional: He is oriented to person, place, and time. He appears well-developed and well-nourished. No distress.  HENT:  Head: Normocephalic and atraumatic.  Right Ear: Hearing normal.  Left Ear: Hearing normal.  Nose: Nose normal.  Eyes: Conjunctivae and lids are normal. Right eye exhibits no discharge. Left eye exhibits no discharge. No scleral icterus.  Pulmonary/Chest: Effort normal. No respiratory distress.  Musculoskeletal: Normal range of motion.  Neurological: He is alert and oriented to person, place, and time.  Skin: Skin is intact. No lesion and no rash noted.  Tan to pink color of upper abdominal sebaceous cyst. Mild tenderness and serous drainage on band-aid.  Psychiatric: He has a normal mood and affect. His speech is normal and behavior is  normal. Thought content normal.      Assessment & Plan:     1. Infected sebaceous cyst of skin Changed bandage at home and removed wick. Still taking the Doxycycline BID. No fever or purulent drainage now. Healing well. Recheck prn. Finish all the antibiotic.       Vernie Murders, PA  [facility]

## 2018-05-13 ENCOUNTER — Other Ambulatory Visit: Payer: Self-pay

## 2018-05-13 ENCOUNTER — Encounter: Payer: Self-pay | Admitting: Family Medicine

## 2018-05-13 ENCOUNTER — Ambulatory Visit (INDEPENDENT_AMBULATORY_CARE_PROVIDER_SITE_OTHER): Payer: PRIVATE HEALTH INSURANCE | Admitting: Family Medicine

## 2018-05-13 VITALS — BP 98/70 | HR 69 | Temp 98.1°F | Ht 72.0 in | Wt 189.2 lb

## 2018-05-13 DIAGNOSIS — E7800 Pure hypercholesterolemia, unspecified: Secondary | ICD-10-CM | POA: Diagnosis not present

## 2018-05-13 DIAGNOSIS — I10 Essential (primary) hypertension: Secondary | ICD-10-CM | POA: Diagnosis not present

## 2018-05-13 DIAGNOSIS — N138 Other obstructive and reflux uropathy: Secondary | ICD-10-CM

## 2018-05-13 DIAGNOSIS — Z0000 Encounter for general adult medical examination without abnormal findings: Secondary | ICD-10-CM | POA: Diagnosis not present

## 2018-05-13 DIAGNOSIS — Z23 Encounter for immunization: Secondary | ICD-10-CM

## 2018-05-13 DIAGNOSIS — N401 Enlarged prostate with lower urinary tract symptoms: Secondary | ICD-10-CM

## 2018-05-13 DIAGNOSIS — G4733 Obstructive sleep apnea (adult) (pediatric): Secondary | ICD-10-CM

## 2018-05-13 NOTE — Progress Notes (Signed)
Patient: Wayne Holland, Male    DOB: [birth year], 62 y.o.   MRN: [id number] Visit Date: 05/13/2018  Today's Provider: Vernie Murders, PA   Chief Complaint  Patient presents with  . Annual Exam   Subjective:    Annual physical exam Wayne Holland is a 62 y.o. male who presents today for health maintenance and complete physical. He feels well. He reports exercising by walking a lot at work. He reports he is sleeping fairly well.  Wt Readings from Last 3 Encounters:  05/13/18 189 lb 3.2 oz (85.8 kg)  03/22/18 187 lb (84.8 kg)  03/19/18 190 lb (86.2 kg)   BP Readings from Last 3 Encounters:  05/13/18 98/70  03/22/18 138/80  03/19/18 130/70    -----------------------------------------------------------------   Review of Systems  Constitutional: Negative.   HENT: Positive for tinnitus. Negative for congestion, dental problem, drooling, ear discharge, ear pain, facial swelling, hearing loss, mouth sores, nosebleeds, postnasal drip, rhinorrhea, sinus pressure, sinus pain, sneezing, sore throat, trouble swallowing and voice change.   Eyes: Negative.   Respiratory: Negative.   Cardiovascular: Negative.   Gastrointestinal: Negative.   Endocrine: Negative.   Genitourinary: Negative.   Musculoskeletal: Negative.   Skin: Negative.   Allergic/Immunologic: Negative.   Neurological: Negative.   Hematological: Negative.   Psychiatric/Behavioral: Negative.    Social History      He  reports that he has quit smoking. He quit after 4.00 years of use. He has never used smokeless tobacco. He reports that he drinks alcohol. He reports that he does not use drugs.       Social History   Socioeconomic History  . Marital status: Married    Spouse name: Not on file  . Number of children: Not on file  . Years of education: Not on file  . Highest education level: Not on file  Occupational History  . Not on file  Social Needs  . Financial resource strain:  Not on file  . Food insecurity:    Worry: Not on file    Inability: Not on file  . Transportation needs:    Medical: Not on file    Non-medical: Not on file  Tobacco Use  . Smoking status: Former Smoker    Years: 4.00  . Smokeless tobacco: Never Used  . Tobacco comment: QUIT IN 1970'S  Substance and Sexual Activity  . Alcohol use: Yes    Alcohol/week: 0.0 standard drinks    Comment: OCCASIONALLY  . Drug use: No  . Sexual activity: Yes    Birth control/protection: None  Lifestyle  . Physical activity:    Days per week: Not on file    Minutes per session: Not on file  . Stress: Not on file  Relationships  . Social connections:    Talks on phone: Not on file    Gets together: Not on file    Attends religious service: Not on file    Active member of club or organization: Not on file    Attends meetings of clubs or organizations: Not on file    Relationship status: Not on file  Other Topics Concern  . Not on file  Social History Narrative  . Not on file   Past Medical History:  Diagnosis Date  . Sleep apnea    Patient Active Problem List   Diagnosis Date Noted  . Essential hypertension 08/11/2016  . Encounter for screening colonoscopy 05/24/2016  .  Pure hypercholesterolemia 03/19/2015  . Obstructive sleep apnea 03/19/2015  . Snoring 03/19/2015  . Tendinitis of right shoulder 03/19/2015  . Incomplete bladder emptying 06/10/2012  . Benign prostatic hyperplasia with urinary obstruction 06/10/2012   Past Surgical History:  Procedure Laterality Date  . COLONOSCOPY  2007  . COLONOSCOPY WITH PROPOFOL N/A 06/28/2016   Procedure: COLONOSCOPY WITH PROPOFOL;  Surgeon: [name], MD;  Location: [facility] ENDOSCOPY;  Service: Endoscopy;  Laterality: N/A;  . NO PAST SURGERIES     Family History        Family Status  Relation Name Status  . Mother  Deceased at age 65  . Father  Deceased at age 15  . Neg Hx  (Not Specified)        His family history includes Dementia in  his mother; Diabetes in his father; Ovarian cancer in his mother; Stroke in his father. There is no history of Colon cancer.     No Known Allergies  Current Outpatient Medications:  .  lisinopril (PRINIVIL,ZESTRIL) 5 MG tablet, TAKE 1 TABLET BY MOUTH EVERY DAY, Disp: 90 tablet, Rfl: 3 .  Omega 3 1200 MG CAPS, Take 2 capsules by mouth daily., Disp: , Rfl:  .  pravastatin (PRAVACHOL) 20 MG tablet, TAKE ONE TABLET BY MOUTH ONE TIME DAILY, Disp: 90 tablet, Rfl: 2 .  tamsulosin (FLOMAX) 0.4 MG CAPS capsule, TAKE 1 CAPSULE (0.4 MG TOTAL) BY MOUTH DAILY WITH BREAKFAST., Disp: , Rfl: 3 .  doxycycline (VIBRA-TABS) 100 MG tablet, Take 1 tablet (100 mg total) by mouth 2 (two) times daily. (Patient not taking: Reported on 05/13/2018), Disp: 14 tablet, Rfl: 0   Patient Care Team: , Vickki Muff, PA as PCP - General (Physician Assistant) , Vickki Muff, PA (Family Medicine) Bary Castilla Forest Gleason, MD (General Surgery)     Objective:   Vitals: BP 98/70 (BP Location: Right Arm, Patient Position: Sitting, Cuff Size: Normal)   Pulse 69   Temp 98.1 F (36.7 C) (Oral)   Ht 6' (1.829 m)   Wt 189 lb 3.2 oz (85.8 kg)   SpO2 98%   BMI 25.66 kg/m    Vitals:   05/13/18 1040  BP: 98/70  Pulse: 69  Temp: 98.1 F (36.7 C)  TempSrc: Oral  SpO2: 98%  Weight: 189 lb 3.2 oz (85.8 kg)  Height: 6' (1.829 m)    Physical Exam  Constitutional: He is oriented to person, place, and time. He appears well-developed and well-nourished.  HENT:  Head: Normocephalic and atraumatic.  Right Ear: External ear normal.  Left Ear: External ear normal.  Nose: Nose normal.  Mouth/Throat: Oropharynx is clear and moist.  Eyes: Pupils are equal, round, and reactive to light. Conjunctivae and EOM are normal. Right eye exhibits no discharge.  Neck: Normal range of motion. Neck supple. No tracheal deviation present. No thyromegaly present.  Cardiovascular: Normal rate, regular rhythm, normal heart sounds and intact distal  pulses.  No murmur heard. Pulmonary/Chest: Effort normal and breath sounds normal. No respiratory distress. He has no wheezes. He has no rales. He exhibits no tenderness.  Abdominal: Soft. He exhibits no distension and no mass. There is no tenderness. There is no rebound and no guarding.  Genitourinary:  Genitourinary Comments: Defer exam to urologist planned in 6 months.  Musculoskeletal: Normal range of motion. He exhibits no edema or tenderness.  Lymphadenopathy:    He has no cervical adenopathy.  Neurological: He is alert and oriented to person, place, and time. He has normal  reflexes. He displays normal reflexes. No cranial nerve deficit. He exhibits normal muscle tone. Coordination normal.  Skin: Skin is warm and dry. No rash noted. No erythema.  Psychiatric: He has a normal mood and affect. His behavior is normal. Judgment and thought content normal.    Depression Screen PHQ 2/9 Scores 05/13/2018 05/10/2017 05/04/2016  PHQ - 2 Score 0 0 0   Assessment & Plan:     Routine Health Maintenance and Physical Exam  Exercise Activities and Dietary recommendations Goals   Walk 4-5 miles a day at work. Monitor indicates 12,000-15,000 steps a day.  Immunization History  Administered Date(s) Administered  . Influenza Split 04/09/2012  . Influenza,inj,Quad PF,6+ Mos 04/17/2013, 04/24/2014, 04/26/2015, 05/04/2016, 05/10/2017  . Tdap 04/07/2011  . Zoster 05/04/2016   Health Maintenance  Topic Date Due  . INFLUENZA VACCINE  01/24/2018  . TETANUS/TDAP  04/06/2021  . COLONOSCOPY  06/28/2026  . Hepatitis C Screening  Completed  . HIV Screening  Completed   Discussed health benefits of physical activity, and encouraged him to engage in regular exercise appropriate for his age and condition.    ------------------------------------------------------------------- 1. Annual physical exam Good general health. Mild tinnitus that does not disturb sleep or have hearing loss. Will up date flu  shot. Tetanus up to date and had colonoscopy in 2018 that showed a benign appendiceal orafice polyp. Will get re-screened in 10 years. Hepatitis C screening was negative on 04-11-12. Check routine labs and follow up pending reports. - CBC with Differential/Platelet - Comprehensive metabolic panel - Lipid panel - TSH  2. Benign prostatic hyperplasia with urinary obstruction Flomax continues to maintain open urine flow. No nocturia of significance or frequency. Has annual follow up with urologist (Dr. Bernardo Heater) and will get PSA at that time. Recheck CMP to assess renal function. - Comprehensive metabolic panel  3. Essential hypertension Well controlled with Lisinopril 5 mg qd. No chest pains, palpitations, dyspnea or dizziness. Continue to restrict sodium, take medication as prescribed and follow up labs. Recheck pending reports. - CBC with Differential/Platelet - Comprehensive metabolic panel - TSH  4. Pure hypercholesterolemia Tolerating Pravastatin 20 mg qd with Omega-3 1200 mg 2 qd and low fat diet without side effects. Continue regular exercise program and recheck CMP, Lipid Panel and TSH. - Comprehensive metabolic panel - Lipid panel - TSH  5. Obstructive sleep apnea Using CPAP 8 cm H2O each night and will sleep 4-5 hours a night. Check routine labs and follow up pending reports. - CBC with Differential/Platelet  6. Need for influenza vaccination - Flu Vaccine QUAD 6+ mos PF IM (Fluarix Quad PF)    Vernie Murders, PA  [facility]

## 2018-05-14 LAB — CBC WITH DIFFERENTIAL/PLATELET
BASOS ABS: 0 10*3/uL (ref 0.0–0.2)
Basos: 0 %
EOS (ABSOLUTE): 0.1 10*3/uL (ref 0.0–0.4)
Eos: 1 %
Hematocrit: 46 % (ref 37.5–51.0)
Hemoglobin: 16.1 g/dL (ref 13.0–17.7)
Immature Grans (Abs): 0 10*3/uL (ref 0.0–0.1)
Immature Granulocytes: 0 %
LYMPHS ABS: 2 10*3/uL (ref 0.7–3.1)
Lymphs: 28 %
MCH: 31 pg (ref 26.6–33.0)
MCHC: 35 g/dL (ref 31.5–35.7)
MCV: 89 fL (ref 79–97)
MONOS ABS: 0.5 10*3/uL (ref 0.1–0.9)
Monocytes: 8 %
NEUTROS ABS: 4.4 10*3/uL (ref 1.4–7.0)
Neutrophils: 63 %
Platelets: 264 10*3/uL (ref 150–450)
RBC: 5.19 x10E6/uL (ref 4.14–5.80)
RDW: 12.4 % (ref 12.3–15.4)
WBC: 7.1 10*3/uL (ref 3.4–10.8)

## 2018-05-14 LAB — COMPREHENSIVE METABOLIC PANEL
ALBUMIN: 4.9 g/dL — AB (ref 3.6–4.8)
ALK PHOS: 68 IU/L (ref 39–117)
ALT: 29 IU/L (ref 0–44)
AST: 16 IU/L (ref 0–40)
Albumin/Globulin Ratio: 2.2 (ref 1.2–2.2)
BILIRUBIN TOTAL: 0.7 mg/dL (ref 0.0–1.2)
BUN/Creatinine Ratio: 12 (ref 10–24)
BUN: 12 mg/dL (ref 8–27)
CHLORIDE: 99 mmol/L (ref 96–106)
CO2: 22 mmol/L (ref 20–29)
CREATININE: 0.97 mg/dL (ref 0.76–1.27)
Calcium: 9.6 mg/dL (ref 8.6–10.2)
GFR calc Af Amer: 96 mL/min/{1.73_m2} (ref >59)
GFR calc non Af Amer: 83 mL/min/{1.73_m2} (ref >59)
GLOBULIN, TOTAL: 2.2 g/dL (ref 1.5–4.5)
GLUCOSE: 108 mg/dL — AB (ref 65–99)
Potassium: 4.3 mmol/L (ref 3.5–5.2)
Sodium: 138 mmol/L (ref 134–144)
Total Protein: 7.1 g/dL (ref 6.0–8.5)

## 2018-05-14 LAB — LIPID PANEL
CHOLESTEROL TOTAL: 177 mg/dL (ref 100–199)
Chol/HDL Ratio: 2.6 ratio (ref 0.0–5.0)
HDL: 67 mg/dL (ref >39)
LDL CALC: 91 mg/dL (ref 0–99)
TRIGLYCERIDES: 94 mg/dL (ref 0–149)
VLDL Cholesterol Cal: 19 mg/dL (ref 5–40)

## 2018-05-14 LAB — TSH: TSH: 0.42 u[IU]/mL — AB (ref 0.450–4.500)

## 2018-06-27 ENCOUNTER — Other Ambulatory Visit: Payer: Self-pay | Admitting: Urology

## 2018-06-27 MED ORDER — TAMSULOSIN HCL 0.4 MG PO CAPS: 0.4000 mg | ORAL_CAPSULE | Freq: Every day | ORAL | 5 refills | Status: DC

## 2018-06-27 NOTE — Telephone Encounter (Signed)
Pt lmom asking for a refill of a Rx. Pt did not leave details of Rx or pharmacy of choice. Please advise pt at [phone]. Thanks.

## 2018-06-27 NOTE — Telephone Encounter (Signed)
Pt needs a new RX for Tamsulosin (originally from Dr Jacqlyn Larsen) sent to [facility] in [employer] on [address]

## 2018-06-27 NOTE — Telephone Encounter (Signed)
RX sent to pharmacy  

## 2018-09-19 ENCOUNTER — Other Ambulatory Visit: Payer: Self-pay | Admitting: Family Medicine

## 2018-09-19 DIAGNOSIS — E7800 Pure hypercholesterolemia, unspecified: Secondary | ICD-10-CM

## 2018-12-19 ENCOUNTER — Telehealth: Payer: Self-pay | Admitting: Urology

## 2018-12-19 NOTE — Telephone Encounter (Signed)
We had to move the patient's one year out until 02-20-19 and he needs a refill on his tamsulosin. Can we send this in until he is seen please?   Sharyn Lull

## 2018-12-20 MED ORDER — TAMSULOSIN HCL 0.4 MG PO CAPS: 0.4000 mg | ORAL_CAPSULE | Freq: Every day | ORAL | 2 refills | Status: DC

## 2018-12-20 NOTE — Telephone Encounter (Signed)
Rx sent 

## 2019-01-17 ENCOUNTER — Other Ambulatory Visit: Payer: Self-pay | Admitting: Family Medicine

## 2019-01-17 DIAGNOSIS — I10 Essential (primary) hypertension: Secondary | ICD-10-CM

## 2019-01-20 ENCOUNTER — Ambulatory Visit: Payer: PRIVATE HEALTH INSURANCE | Admitting: Urology

## 2019-02-20 ENCOUNTER — Ambulatory Visit (INDEPENDENT_AMBULATORY_CARE_PROVIDER_SITE_OTHER): Payer: BLUE CROSS/BLUE SHIELD | Admitting: Urology

## 2019-02-20 ENCOUNTER — Other Ambulatory Visit: Payer: Self-pay

## 2019-02-20 ENCOUNTER — Encounter: Payer: Self-pay | Admitting: Urology

## 2019-02-20 VITALS — BP 124/80 | HR 76 | Ht 70.0 in | Wt 180.0 lb

## 2019-02-20 DIAGNOSIS — N401 Enlarged prostate with lower urinary tract symptoms: Secondary | ICD-10-CM

## 2019-02-20 DIAGNOSIS — N138 Other obstructive and reflux uropathy: Secondary | ICD-10-CM | POA: Diagnosis not present

## 2019-02-21 LAB — PSA: Prostate Specific Ag, Serum: 1.7 ng/mL (ref 0.0–4.0)

## 2019-02-21 NOTE — Progress Notes (Signed)
02/20/2019 8:08 AM   Wayne Holland 1956/04/11 [id number]  Referring provider: Margo Common, [address] [place],  [place] [address]  Chief Complaint  Patient presents with  . Benign Prostatic Hypertrophy    Urologic history: 1.  BPH with lower urinary tract symptoms  -Previously followed by Dr. Jacqlyn Larsen  -Cystoscopy January 2019 with lateral lobe enlargement and bilobar intravesical protrusion  -Tamsulosin started January 2019  HPI: 63 y.o. male presents for annual follow-up of BPH.  Since his visit last year he has noted some increased urinary frequency and urgency.  He has nocturia x2.  IPSS completed today was 12/3.  He denies dysuria or gross hematuria.  He has no flank, abdominal, pelvic or scrotal pain.  He did not have a PSA last year and it was 1.66 in November 2018.   PMH: Past Medical History:  Diagnosis Date  . Sleep apnea     Surgical History: Past Surgical History:  Procedure Laterality Date  . COLONOSCOPY  2007  . COLONOSCOPY WITH PROPOFOL N/A 06/28/2016   Procedure: COLONOSCOPY WITH PROPOFOL;  Surgeon: [name], MD;  Location[facility] ENDOSCOPY;  Service: Endoscopy;  Laterality: N/A;  . NO PAST SURGERIES      Home Medications:  Allergies as of 02/20/2019   No Known Allergies     Medication List       Accurate as of February 20, 2019 11:59 PM. If you have any questions, ask your nurse or doctor.        lisinopril 5 MG tablet Commonly known as: ZESTRIL TAKE 1 TABLET BY MOUTH EVERY DAY   Omega 3 1200 MG Caps Take 2 capsules by mouth daily.   pravastatin 20 MG tablet Commonly known as: PRAVACHOL TAKE ONE TABLET BY MOUTH ONE TIME DAILY   tamsulosin 0.4 MG Caps capsule Commonly known as: FLOMAX Take 1 capsule (0.4 mg total) by mouth daily.       Allergies: No Known Allergies  Family History: Family History  Problem Relation Age of Onset  . Dementia Mother   . Ovarian cancer Mother   . Stroke Father   . Diabetes  Father   . Colon cancer Neg Hx     Social History:  reports that he has quit smoking. He quit after 4.00 years of use. He has never used smokeless tobacco. He reports current alcohol use. He reports that he does not use drugs.  ROS: UROLOGY Frequent Urination?: Yes Hard to postpone urination?: No Burning/pain with urination?: No Get up at night to urinate?: Yes Leakage of urine?: No Urine stream starts and stops?: No Trouble starting stream?: No Do you have to strain to urinate?: No Blood in urine?: No Urinary tract infection?: No Sexually transmitted disease?: No Injury to kidneys or bladder?: No Painful intercourse?: No Weak stream?: No Erection problems?: No Penile pain?: No  Gastrointestinal Nausea?: No Vomiting?: No Indigestion/heartburn?: No Diarrhea?: No Constipation?: No  Constitutional Fever: No Night sweats?: No Weight loss?: No Fatigue?: No  Skin Skin rash/lesions?: No Itching?: No  Eyes Blurred vision?: No Double vision?: No  Ears/Nose/Throat Sore throat?: No Sinus problems?: No  Hematologic/Lymphatic Swollen glands?: No Easy bruising?: No  Cardiovascular Leg swelling?: No Chest pain?: No  Respiratory Cough?: No Shortness of breath?: No  Endocrine Excessive thirst?: No  Musculoskeletal Back pain?: No Joint pain?: No  Neurological Headaches?: No Dizziness?: No  Psychologic Depression?: No Anxiety?: No  Physical Exam: BP 124/80 (BP Location: Left Arm, Patient Position: Sitting, Cuff Size:  Normal)   Pulse 76   Ht 5\' 10"  (1.778 m)   Wt 180 lb (81.6 kg)   BMI 25.83 kg/m   Constitutional:  Alert and oriented, No acute distress. HEENT: [place] AT, moist mucus membranes.  Trachea midline, no masses. Cardiovascular: No clubbing, cyanosis, or edema. Respiratory: Normal respiratory effort, no increased work of breathing. GI: Abdomen is soft, nontender, nondistended, no abdominal masses GU: No CVA tenderness.  Prostate 45 g, smooth  without nodules Lymph: No cervical or inguinal lymphadenopathy. Skin: No rashes, bruises or suspicious lesions. Neurologic: Grossly intact, no focal deficits, moving all 4 extremities. Psychiatric: Normal mood and affect.   Assessment & Plan:    1. Benign prostatic hyperplasia with urinary obstruction He has noted slight worsening of his voiding pattern in the last 12 months.  I discussed options of increasing tamsulosin, adding a 5-ARI.  Outlet procedures including UroLift were also discussed.  Presently he does not desire any change in his treatment plan.  Continue annual follow-up and he was instructed to call back if his voiding symptoms worsen.  PSA was drawn today.   Abbie Sons, [facility] [address] [place], [place] [address] [phone]

## 2019-02-24 ENCOUNTER — Encounter: Payer: Self-pay | Admitting: Urology

## 2019-02-25 ENCOUNTER — Telehealth: Payer: Self-pay | Admitting: *Deleted

## 2019-02-25 NOTE — Telephone Encounter (Signed)
-----   Message from Abbie Sons, MD sent at 02/25/2019  7:43 AM EDT ----- PSA stable at 1.7

## 2019-02-25 NOTE — Telephone Encounter (Signed)
Notified patient as instructed, patient pleased. Discussed follow-up appointments, patient agrees  

## 2019-03-19 ENCOUNTER — Other Ambulatory Visit: Payer: Self-pay

## 2019-03-19 DIAGNOSIS — N401 Enlarged prostate with lower urinary tract symptoms: Secondary | ICD-10-CM

## 2019-03-19 DIAGNOSIS — N138 Other obstructive and reflux uropathy: Secondary | ICD-10-CM

## 2019-03-19 MED ORDER — TAMSULOSIN HCL 0.4 MG PO CAPS: 0.4000 mg | ORAL_CAPSULE | Freq: Every day | ORAL | 3 refills | Status: DC

## 2019-05-15 ENCOUNTER — Other Ambulatory Visit: Payer: Self-pay

## 2019-05-15 ENCOUNTER — Ambulatory Visit (INDEPENDENT_AMBULATORY_CARE_PROVIDER_SITE_OTHER): Payer: BLUE CROSS/BLUE SHIELD | Admitting: Family Medicine

## 2019-05-15 VITALS — BP 130/80 | HR 65 | Temp 96.9°F | Ht 70.0 in | Wt 193.0 lb

## 2019-05-15 DIAGNOSIS — G4733 Obstructive sleep apnea (adult) (pediatric): Secondary | ICD-10-CM

## 2019-05-15 DIAGNOSIS — N401 Enlarged prostate with lower urinary tract symptoms: Secondary | ICD-10-CM | POA: Diagnosis not present

## 2019-05-15 DIAGNOSIS — I10 Essential (primary) hypertension: Secondary | ICD-10-CM | POA: Diagnosis not present

## 2019-05-15 DIAGNOSIS — E7800 Pure hypercholesterolemia, unspecified: Secondary | ICD-10-CM

## 2019-05-15 DIAGNOSIS — Z0000 Encounter for general adult medical examination without abnormal findings: Secondary | ICD-10-CM

## 2019-05-15 DIAGNOSIS — N138 Other obstructive and reflux uropathy: Secondary | ICD-10-CM

## 2019-05-15 NOTE — Progress Notes (Signed)
Patient: Wayne Holland, Male    DOB: 12-04-1955, 64 y.o.   MRN: [id number] Visit Date: 05/15/2019  Today's Provider: Vernie Murders, PA   Chief Complaint  Patient presents with  . Annual Exam   Subjective:  Wayne Holland is a 63 y.o. male who presents today for health maintenance and complete physical. He feels well. He reports exercising daily. He reports he is sleeping well.  Immunization History  Administered Date(s) Administered  . Influenza Split 04/09/2012  . Influenza,inj,Quad PF,6+ Mos 04/17/2013, 04/24/2014, 04/26/2015, 05/04/2016, 05/10/2017, 05/13/2018  . Influenza-Unspecified 03/27/2019  . Tdap 04/07/2011  . Zoster 05/04/2016   06/28/16 Colonoscopy  Review of Systems  Constitutional: Negative.   HENT: Negative.   Eyes: Negative.   Respiratory: Negative.   Cardiovascular: Negative.   Gastrointestinal: Negative.   Endocrine: Negative.   Genitourinary: Negative.   Musculoskeletal: Negative.   Skin: Negative.   Allergic/Immunologic: Negative.   Neurological: Negative.   Hematological: Negative.   Psychiatric/Behavioral: Negative.     Social History   Socioeconomic History  . Marital status: Married    Spouse name: Not on file  . Number of children: Not on file  . Years of education: Not on file  . Highest education level: Not on file  Occupational History  . Not on file  Social Needs  . Financial resource strain: Not on file  . Food insecurity    Worry: Not on file    Inability: Not on file  . Transportation needs    Medical: Not on file    Non-medical: Not on file  Tobacco Use  . Smoking status: Former Smoker    Years: 4.00  . Smokeless tobacco: Never Used  . Tobacco comment: QUIT IN 1970'S  Substance and Sexual Activity  . Alcohol use: Yes    Alcohol/week: 0.0 standard drinks    Comment: OCCASIONALLY  . Drug use: No  . Sexual activity: Yes    Birth control/protection: None  Lifestyle  . Physical activity    Days per week: Not  on file    Minutes per session: Not on file  . Stress: Not on file  Relationships  . Social Herbalist on phone: Not on file    Gets together: Not on file    Attends religious service: Not on file    Active member of club or organization: Not on file    Attends meetings of clubs or organizations: Not on file    Relationship status: Not on file  . Intimate partner violence    Fear of current or ex partner: Not on file    Emotionally abused: Not on file    Physically abused: Not on file    Forced sexual activity: Not on file  Other Topics Concern  . Not on file  Social History Narrative  . Not on file    Patient Active Problem List   Diagnosis Date Noted  . Essential hypertension 08/11/2016  . Encounter for screening colonoscopy 05/24/2016  . Pure hypercholesterolemia 03/19/2015  . Obstructive sleep apnea 03/19/2015  . Snoring 03/19/2015  . Tendinitis of right shoulder 03/19/2015  . Incomplete bladder emptying 06/10/2012  . Benign prostatic hyperplasia with urinary obstruction 06/10/2012  . Nodular prostate with urinary obstruction 06/10/2012    Past Surgical History:  Procedure Laterality Date  . COLONOSCOPY  2007  . COLONOSCOPY WITH PROPOFOL N/A 06/28/2016   Procedure: COLONOSCOPY WITH PROPOFOL;  Surgeon: [name], MD;  Location: [facility] ENDOSCOPY;  Service:  Endoscopy;  Laterality: N/A;  . NO PAST SURGERIES      His family history includes Dementia in his mother; Diabetes in his father; Ovarian cancer in his mother; Stroke in his father.     Outpatient Encounter Medications as of 05/15/2019  Medication Sig  . lisinopril (ZESTRIL) 5 MG tablet TAKE 1 TABLET BY MOUTH EVERY DAY  . Omega 3 1200 MG CAPS Take 2 capsules by mouth daily.  . pravastatin (PRAVACHOL) 20 MG tablet TAKE ONE TABLET BY MOUTH ONE TIME DAILY  . tamsulosin (FLOMAX) 0.4 MG CAPS capsule Take 1 capsule (0.4 mg total) by mouth daily.   No facility-administered encounter medications on file  as of 05/15/2019.     Patient Care Team: Angeleigh Chiasson, Vickki Muff, PA as PCP - General (Physician Assistant) Jovanka Westgate, Vickki Muff, [place] (Family Medicine) [name], MD (General Surgery)      Objective:   Vitals:  Vitals:   05/15/19 1058  BP: 130/80  Pulse: 65  Temp: (!) 96.9 F (36.1 C)  TempSrc: Skin  SpO2: 99%  Weight: 193 lb (87.5 kg)  Height: 5\' 10"  (1.778 m)    Physical Exam Constitutional:      Appearance: He is well-developed.  HENT:     Head: Normocephalic and atraumatic.     Right Ear: External ear normal.     Left Ear: External ear normal.     Nose: Nose normal.  Eyes:     General:        Right eye: No discharge.     Conjunctiva/sclera: Conjunctivae normal.     Pupils: Pupils are equal, round, and reactive to light.  Neck:     Musculoskeletal: Normal range of motion and neck supple.     Thyroid: No thyromegaly.     Trachea: No tracheal deviation.  Cardiovascular:     Rate and Rhythm: Normal rate and regular rhythm.     Heart sounds: Normal heart sounds. No murmur.  Pulmonary:     Effort: Pulmonary effort is normal. No respiratory distress.     Breath sounds: Normal breath sounds. No wheezing or rales.  Chest:     Chest wall: No tenderness.  Abdominal:     General: There is no distension.     Palpations: Abdomen is soft. There is no mass.     Tenderness: There is no abdominal tenderness. There is no guarding or rebound.  Genitourinary:    Comments: Accomplished by urologist with history of BPH. Musculoskeletal: Normal range of motion.        General: No tenderness.  Lymphadenopathy:     Cervical: No cervical adenopathy.  Skin:    General: Skin is warm and dry.     Findings: No erythema or rash.  Neurological:     Mental Status: He is alert and oriented to person, place, and time.     Cranial Nerves: No cranial nerve deficit.     Motor: No abnormal muscle tone.     Coordination: Coordination normal.     Deep Tendon Reflexes: Reflexes are  normal and symmetric. Reflexes normal.  Psychiatric:        Behavior: Behavior normal.        Thought Content: Thought content normal.        Judgment: Judgment normal.     Fall Risk  05/15/2019 05/13/2018 05/10/2017  Falls in the past year? 0 0 No  Number falls in past yr: 0 - -  Injury with Fall? 0 - -  Depression Screen PHQ 2/9 Scores 05/15/2019 05/13/2018 05/10/2017 05/04/2016  PHQ - 2 Score 0 0 0 0     Office Visit from 05/15/2019 in [place]  AUDIT-C Score  1     Functional Status Survey: Is the patient deaf or have difficulty hearing?: No Does the patient have difficulty seeing, even when wearing glasses/contacts?: No Does the patient have difficulty concentrating, remembering, or making decisions?: No Does the patient have difficulty walking or climbing stairs?: No Does the patient have difficulty dressing or bathing?: No Does the patient have difficulty doing errands alone such as visiting a doctor's office or shopping?: No   Current Exercise Habits: Home exercise routine, Type of exercise: walking, Frequency (Times/Week): 7    Assessment & Plan:     Routine Health Maintenance and Physical Exam  Exercise Activities and Dietary recommendations Goals   Continue low fat diet and exercise 30-40 minutes 4 days a week.     Immunization History  Administered Date(s) Administered  . Influenza Split 04/09/2012  . Influenza,inj,Quad PF,6+ Mos 04/17/2013, 04/24/2014, 04/26/2015, 05/04/2016, 05/10/2017, 05/13/2018  . Influenza-Unspecified 03/27/2019  . Tdap 04/07/2011  . Zoster 05/04/2016    Health Maintenance  Topic Date Due  . Samul Dada  04/06/2021  . COLONOSCOPY  06/28/2026  . INFLUENZA VACCINE  Completed  . Hepatitis C Screening  Completed  . HIV Screening  Completed     Discussed health benefits of physical activity, and encouraged him to engage in regular exercise appropriate for his age and condition.   1. Annual physical  exam Good general health. Immunizations up to date. Colonoscopy accomplished on 06-28-16. Retired 01-24-19 but continues to work 1 week a month. Given anticipatory counseling and will get routine labs. - CBC with Differential/Platelet - Comprehensive metabolic panel - Lipid Panel With LDL/HDL Ratio - TSH  2. Essential hypertension Good control of BP on the Lisinopril 5 mg qd. Restricts salt intake. Recheck routine labs and follow up pending reports. - CBC with Differential/Platelet - Comprehensive metabolic panel - TSH  3. Pure hypercholesterolemia Tolerating Pravastatin 20 mg qd with Omega-3 Fish Oil 1200 mg  2 caps qd. Continue exercise and low fat diet. Recheck CMP, Lipid Panel and TSH to assess control. - Comprehensive metabolic panel - Lipid Panel With LDL/HDL Ratio - TSH  4. Benign prostatic hyperplasia with urinary obstruction No significant frequency, decrease in stream or nocturia. Follow up appointment with urologist (Dr. Bernardo Heater) on 02-20-19. PSA stable at 1.7. Tolerating Tamsulosin 0.4 mg qd without dizziness or hypotension.  5. Obstructive sleep apnea Sleeping well and uses CPAP 3-4 nights a week.

## 2019-05-16 ENCOUNTER — Encounter: Payer: Self-pay | Admitting: Family Medicine

## 2019-05-16 LAB — CBC WITH DIFFERENTIAL/PLATELET
Basophils Absolute: 0 10*3/uL (ref 0.0–0.2)
Basos: 1 %
EOS (ABSOLUTE): 0.1 10*3/uL (ref 0.0–0.4)
Eos: 2 %
Hematocrit: 45.2 % (ref 37.5–51.0)
Hemoglobin: 16.3 g/dL (ref 13.0–17.7)
Immature Grans (Abs): 0 10*3/uL (ref 0.0–0.1)
Immature Granulocytes: 0 %
Lymphocytes Absolute: 1.8 10*3/uL (ref 0.7–3.1)
Lymphs: 33 %
MCH: 31.9 pg (ref 26.6–33.0)
MCHC: 36.1 g/dL — ABNORMAL HIGH (ref 31.5–35.7)
MCV: 89 fL (ref 79–97)
Monocytes Absolute: 0.5 10*3/uL (ref 0.1–0.9)
Monocytes: 10 %
Neutrophils Absolute: 3 10*3/uL (ref 1.4–7.0)
Neutrophils: 54 %
Platelets: 258 10*3/uL (ref 150–450)
RBC: 5.11 x10E6/uL (ref 4.14–5.80)
RDW: 12.6 % (ref 11.6–15.4)
WBC: 5.4 10*3/uL (ref 3.4–10.8)

## 2019-05-16 LAB — LIPID PANEL WITH LDL/HDL RATIO
Cholesterol, Total: 190 mg/dL (ref 100–199)
HDL: 59 mg/dL (ref >39)
LDL Chol Calc (NIH): 115 mg/dL — ABNORMAL HIGH (ref 0–99)
LDL/HDL Ratio: 1.9 ratio (ref 0.0–3.6)
Triglycerides: 88 mg/dL (ref 0–149)
VLDL Cholesterol Cal: 16 mg/dL (ref 5–40)

## 2019-05-16 LAB — COMPREHENSIVE METABOLIC PANEL
ALT: 49 IU/L — ABNORMAL HIGH (ref 0–44)
AST: 26 IU/L (ref 0–40)
Albumin/Globulin Ratio: 2.2 (ref 1.2–2.2)
Albumin: 4.8 g/dL (ref 3.8–4.8)
Alkaline Phosphatase: 76 IU/L (ref 39–117)
BUN/Creatinine Ratio: 15 (ref 10–24)
BUN: 13 mg/dL (ref 8–27)
Bilirubin Total: 0.8 mg/dL (ref 0.0–1.2)
CO2: 21 mmol/L (ref 20–29)
Calcium: 9.7 mg/dL (ref 8.6–10.2)
Chloride: 102 mmol/L (ref 96–106)
Creatinine, Ser: 0.88 mg/dL (ref 0.76–1.27)
GFR calc Af Amer: 106 mL/min/{1.73_m2} (ref >59)
GFR calc non Af Amer: 91 mL/min/{1.73_m2} (ref >59)
Globulin, Total: 2.2 g/dL (ref 1.5–4.5)
Glucose: 105 mg/dL — ABNORMAL HIGH (ref 65–99)
Potassium: 4.8 mmol/L (ref 3.5–5.2)
Sodium: 139 mmol/L (ref 134–144)
Total Protein: 7 g/dL (ref 6.0–8.5)

## 2019-05-16 LAB — TSH: TSH: 0.415 u[IU]/mL — ABNORMAL LOW (ref 0.450–4.500)

## 2019-06-15 ENCOUNTER — Encounter: Payer: Self-pay | Admitting: Family Medicine

## 2019-06-16 ENCOUNTER — Encounter: Payer: Self-pay | Admitting: Family Medicine

## 2019-06-16 ENCOUNTER — Telehealth (INDEPENDENT_AMBULATORY_CARE_PROVIDER_SITE_OTHER): Payer: BLUE CROSS/BLUE SHIELD | Admitting: Family Medicine

## 2019-06-16 ENCOUNTER — Ambulatory Visit: Payer: Self-pay | Admitting: *Deleted

## 2019-06-16 VITALS — BP 125/84 | Temp 97.6°F

## 2019-06-16 DIAGNOSIS — R0981 Nasal congestion: Secondary | ICD-10-CM

## 2019-06-16 DIAGNOSIS — R42 Dizziness and giddiness: Secondary | ICD-10-CM | POA: Diagnosis not present

## 2019-06-16 DIAGNOSIS — R040 Epistaxis: Secondary | ICD-10-CM | POA: Diagnosis not present

## 2019-06-16 NOTE — Patient Instructions (Addendum)
   Tamsulosin can cause or make dizziness worse. Stop taking tamsulosin for 3-4 days, until dizziness goes away. It's also less likely to cause dizziness if you take it at bedtime instead of the morning  . Use OTC nasal saline every 3-4 hours throughout the days and at night before going to bed  . Run a humidifier when the heat is running in your home, especially at night   Let me know if your sinuses are not improving in 3-4 days, or if the dizziness is not getting better

## 2019-06-16 NOTE — Telephone Encounter (Signed)
  Pt called in c/o dizziness that began on Wednesday night.    My head still feels heavy this morning with some dizziness.   I'm having pressure in my sinuses.  See triage notes.  I made a virtual visit with Dr. Caryn Section for this morning 06/16/2019 at 9:00 AM.  I sent my notes to Dr. Caryn Section 's office. Reason for Disposition . [1] MILD dizziness (e.g., walking normally) AND [2] has NOT been evaluated by physician for this  (Exception: dizziness caused by heat exposure, sudden standing, or poor fluid intake)  Answer Assessment - Initial Assessment Questions 1. DESCRIPTION: "Describe your dizziness."     He sent a MyChart message.      I had a  spell Wednesday night of dizziness.  I was watching TV laid back in recliner and when I sat up it hit me the dizziness.  My head still feels heavy this morning.   No fever.      2. LIGHTHEADED: "Do you feel lightheaded?" (e.g., somewhat faint, woozy, weak upon standing)     First thing in morning I blow blood out of my nose since Wednesday.    I've never had a sinus infection.   No known exposure to COVID-19. 3. VERTIGO: "Do you feel like either you or the room is spinning or tilting?" (i.e. vertigo)     No room spinning. 4. SEVERITY: "How bad is it?"  "Do you feel like you are going to faint?" "Can you stand and walk?"   - MILD - walking normally   - MODERATE - interferes with normal activities (e.g., work, school)    - SEVERE - unable to stand, requires support to walk, feels like passing out now.      Mild 5. ONSET:  "When did the dizziness begin?"     Wednesday 6. AGGRAVATING FACTORS: "Does anything make it worse?" (e.g., standing, change in head position)     No 7. HEART RATE: "Can you tell me your heart rate?" "How many beats in 15 seconds?"  (Note: not all patients can do this)       Not asked    No cardiac history. 8. CAUSE: "What do you think is causing the dizziness?"     I don't know.    Maybe the pressure in my sinuses. 9. RECURRENT  SYMPTOM: "Have you had dizziness before?" If so, ask: "When was the last time?" "What happened that time?"     No 10. OTHER SYMPTOMS: "Do you have any other symptoms?" (e.g., fever, chest pain, vomiting, diarrhea, bleeding)       No sore throat no coughing, drainage a little down the back of my throat.   I just get the bloody stuff first thing in the morning when I blow my nose.    No diarrhea or upset stomach.   Still good appetite.  Eating fine.   No change in taste or smell. 11. PREGNANCY: "Is there any chance you are pregnant?" "When was your last menstrual period?"       N/A  Protocols used: DIZZINESS [facility]

## 2019-06-16 NOTE — Telephone Encounter (Signed)
Acknowledged.

## 2019-06-16 NOTE — Progress Notes (Signed)
Patient: Wayne Holland Male    DOB: [birth year]   63 y.o.   MRN: [id number] Visit Date: 06/16/2019  Today's Provider: Lelon Huh, MD   Chief Complaint  Patient presents with  . Dizziness   Subjective:    Virtual Visit via Video Note  I connected with Teofilo Pod on 06/16/19 at  9:00 AM EST by a video enabled telemedicine application and verified that I am speaking with the correct person using two identifiers.  Location: Patient: home Provider: [facility]   I discussed the limitations of evaluation and management by telemedicine and the availability of in person appointments. The patient expressed understanding and agreed to proceed.     Dizziness This is a new problem. Episode onset: 5 days ago. The problem has been waxing and waning. Pertinent negatives include no abdominal pain, chest pain, chills, coughing, fatigue, fever, headaches, nausea, sore throat or vomiting. He has tried nothing for the symptoms.  Started Wednesday with real bad spell. Improved a few days, but worse yesterday. Described as a light headed sensation. Having some congestion and drainage. Blowing some blood from nose in the morning. No OTC sinus medications. BP this am was 144/87.  No dyspnea. Minor cough. No change in taste or smell. No Covid Expsores  No Known Allergies   Current Outpatient Medications:  .  lisinopril (ZESTRIL) 5 MG tablet, TAKE 1 TABLET BY MOUTH EVERY DAY, Disp: 90 tablet, Rfl: 3 .  Omega 3 1200 MG CAPS, Take 2 capsules by mouth daily., Disp: , Rfl:  .  pravastatin (PRAVACHOL) 20 MG tablet, TAKE ONE TABLET BY MOUTH ONE TIME DAILY, Disp: 90 tablet, Rfl: 2 .  tamsulosin (FLOMAX) 0.4 MG CAPS capsule, Take 1 capsule (0.4 mg total) by mouth daily., Disp: 90 capsule, Rfl: 3  Review of Systems  Constitutional: Negative for appetite change, chills, fatigue and fever.  HENT: Positive for nosebleeds (when blowing nose), postnasal drip, sinus pressure and sneezing. Negative  for sore throat.   Respiratory: Negative for cough, chest tightness, shortness of breath and wheezing.   Cardiovascular: Negative for chest pain and palpitations.  Gastrointestinal: Negative for abdominal pain, nausea and vomiting.  Neurological: Positive for dizziness and light-headedness. Negative for headaches.    Social History   Tobacco Use  . Smoking status: Former Smoker    Years: 4.00  . Smokeless tobacco: Never Used  . Tobacco comment: QUIT IN 1970'S  Substance Use Topics  . Alcohol use: Yes    Alcohol/week: 0.0 standard drinks    Comment: OCCASIONALLY      Objective:   BP 125/84   Temp 97.6 F (36.4 C) (Temporal)  Vitals:   06/16/19 0839  BP: 125/84  Temp: 97.6 F (36.4 C)  TempSrc: Temporal  There is no height or weight on file to calculate BMI.   Physical Exam  Awake, alert, oriented x 3.      Assessment & Plan     1. Epistaxis Likely secondary to dry nasal passages. Recommend use of nasal saline. Had normal CBC last month.   2. Sinus congestion   3. Dizziness Multifactorial. Likely combination of sinus sx as above and tamsulosin, and patient advised to put that on hold for the time being.    I discussed the assessment and treatment plan with the patient. The patient was provided an opportunity to ask questions and all were answered. The patient agreed with the plan and demonstrated an understanding of the instructions.   The  patient was advised to call back or seek an in-person evaluation if the symptoms worsen or if the condition fails to improve as anticipated.  I provided 10 minutes of non-face-to-face time during this encounter.  The entirety of the information documented in the History of Present Illness, Review of Systems and Physical Exam were personally obtained by me. Portions of this information were initially documented by Meyer Cory, CMA and reviewed by me for thoroughness and accuracy.      Lelon Huh, MD  [facility]

## 2019-08-07 ENCOUNTER — Other Ambulatory Visit: Payer: Self-pay | Admitting: Family Medicine

## 2019-08-07 DIAGNOSIS — E7800 Pure hypercholesterolemia, unspecified: Secondary | ICD-10-CM

## 2019-08-07 NOTE — Telephone Encounter (Signed)
Requested Prescriptions  Pending Prescriptions Disp Refills  . pravastatin (PRAVACHOL) 20 MG tablet [Pharmacy Med Name: PRAVASTATIN SODIUM 20 MG TAB] 90 tablet 2    Sig: TAKE 1 TABLET BY MOUTH EVERY DAY     Cardiovascular:  Antilipid - Statins Failed - 08/07/2019  2:22 PM      Failed - LDL in normal range and within 360 days    LDL Cholesterol (Calc)  Date Value Ref Range Status  05/10/2017 89 mg/dL (calc) Final    Comment:    Reference range: <100 . Desirable range <100 mg/dL for primary prevention;   <70 mg/dL for patients with CHD or diabetic patients  with > or = 2 CHD risk factors. Marland Kitchen LDL-C is now calculated using the Martin-Hopkins  calculation, which is a validated novel method providing  better accuracy than the Friedewald equation in the  estimation of LDL-C.  Cresenciano Genre et al. Annamaria Helling. [id number]): 2061-2068  (http://education.[employer].com/faq/FAQ164)    LDL Chol Calc (NIH)  Date Value Ref Range Status  05/15/2019 115 (H) 0 - 99 mg/dL Final         Passed - Total Cholesterol in normal range and within 360 days    Cholesterol, Total  Date Value Ref Range Status  05/15/2019 190 100 - 199 mg/dL Final         Passed - HDL in normal range and within 360 days    HDL  Date Value Ref Range Status  05/15/2019 59 >39 mg/dL Final         Passed - Triglycerides in normal range and within 360 days    Triglycerides  Date Value Ref Range Status  05/15/2019 88 0 - 149 mg/dL Final         Passed - Patient is not pregnant      Passed - Valid encounter within last 12 months    Recent Outpatient Visits          1 month ago Epistaxis   [facility] Birdie Sons, MD   2 months ago Annual physical exam   [facility], Vickki Muff, [place]   1 year ago Annual physical exam   [place], [place]   1 year ago Infected sebaceous cyst of skin   [facility], [place], [place]   1 year ago  Infected sebaceous cyst of skin   [facility], Vickki Muff, [place]      Future Appointments            In 6 months [place] [facility], Ronda Fairly, MD [place]

## 2019-08-13 ENCOUNTER — Telehealth: Payer: Self-pay

## 2019-08-13 NOTE — Telephone Encounter (Signed)
Copied from [place] [phone]. Topic: General - Other >> Aug 13, 2019  1:18 PM Celene Kras wrote: Reason for CRM: Pt called and is requesting to have lab orders placed for his 3 month check up. Please advise.

## 2019-08-14 ENCOUNTER — Other Ambulatory Visit: Payer: Self-pay | Admitting: Family Medicine

## 2019-08-14 DIAGNOSIS — E7800 Pure hypercholesterolemia, unspecified: Secondary | ICD-10-CM

## 2019-08-14 NOTE — Telephone Encounter (Signed)
Future order for follow up labs placed in chart. Can be printed out when he comes in for blood draw.

## 2019-08-15 NOTE — Telephone Encounter (Signed)
Left detailed message pt labs were ordered.

## 2019-08-18 ENCOUNTER — Other Ambulatory Visit: Payer: Self-pay | Admitting: Family Medicine

## 2019-08-18 ENCOUNTER — Other Ambulatory Visit: Payer: Self-pay

## 2019-08-18 DIAGNOSIS — E7800 Pure hypercholesterolemia, unspecified: Secondary | ICD-10-CM

## 2019-08-19 LAB — COMPREHENSIVE METABOLIC PANEL
ALT: 29 IU/L (ref 0–44)
AST: 21 IU/L (ref 0–40)
Albumin/Globulin Ratio: 2 (ref 1.2–2.2)
Albumin: 4.5 g/dL (ref 3.8–4.8)
Alkaline Phosphatase: 80 IU/L (ref 39–117)
BUN/Creatinine Ratio: 14 (ref 10–24)
BUN: 14 mg/dL (ref 8–27)
Bilirubin Total: 0.6 mg/dL (ref 0.0–1.2)
CO2: 21 mmol/L (ref 20–29)
Calcium: 9.2 mg/dL (ref 8.6–10.2)
Chloride: 103 mmol/L (ref 96–106)
Creatinine, Ser: 1 mg/dL (ref 0.76–1.27)
GFR calc Af Amer: 92 mL/min/{1.73_m2} (ref >59)
GFR calc non Af Amer: 80 mL/min/{1.73_m2} (ref >59)
Globulin, Total: 2.2 g/dL (ref 1.5–4.5)
Glucose: 117 mg/dL — ABNORMAL HIGH (ref 65–99)
Potassium: 4.4 mmol/L (ref 3.5–5.2)
Sodium: 140 mmol/L (ref 134–144)
Total Protein: 6.7 g/dL (ref 6.0–8.5)

## 2019-08-19 LAB — LIPID PANEL
Chol/HDL Ratio: 3.3 ratio (ref 0.0–5.0)
Cholesterol, Total: 173 mg/dL (ref 100–199)
HDL: 53 mg/dL (ref >39)
LDL Chol Calc (NIH): 103 mg/dL — ABNORMAL HIGH (ref 0–99)
Triglycerides: 90 mg/dL (ref 0–149)
VLDL Cholesterol Cal: 17 mg/dL (ref 5–40)

## 2019-08-31 ENCOUNTER — Ambulatory Visit: Payer: PRIVATE HEALTH INSURANCE | Attending: Internal Medicine

## 2019-08-31 DIAGNOSIS — Z23 Encounter for immunization: Secondary | ICD-10-CM | POA: Insufficient documentation

## 2019-08-31 NOTE — Progress Notes (Signed)
   Covid-19 Vaccination Clinic  Name:  Wayne Holland    MRN: [id number] DOB: 04-23-1956  08/31/2019  Mr. Wayne Holland was observed post Covid-19 immunization for 15 minutes without incident. He was provided with Vaccine Information Sheet and instruction to access the V-Safe system.   Mr. Wayne Holland was instructed to call 911 with any severe reactions post vaccine: Marland Kitchen Difficulty breathing  . Swelling of face and throat  . A fast heartbeat  . A bad rash all over body  . Dizziness and weakness   Immunizations Administered    Name Date Dose VIS Date Route   Pfizer COVID-19 Vaccine 08/31/2019  8:46 AM 0.3 mL 06/06/2019 Intramuscular   Manufacturer: [employer]   Lot: [id number]   [place]: [id number]

## 2019-09-22 ENCOUNTER — Ambulatory Visit: Payer: PRIVATE HEALTH INSURANCE | Attending: Internal Medicine

## 2019-09-22 DIAGNOSIS — Z23 Encounter for immunization: Secondary | ICD-10-CM

## 2019-09-22 NOTE — Progress Notes (Signed)
   Covid-19 Vaccination Clinic  Name:  Wayne Holland    MRN: [id number] DOB: 10/20/1955  09/22/2019  Mr. Connally was observed post Covid-19 immunization for 15 minutes without incident. He was provided with Vaccine Information Sheet and instruction to access the V-Safe system.   Mr. Nezat was instructed to call 911 with any severe reactions post vaccine: Marland Kitchen Difficulty breathing  . Swelling of face and throat  . A fast heartbeat  . A bad rash all over body  . Dizziness and weakness   Immunizations Administered    Name Date Dose VIS Date Route   Pfizer COVID-19 Vaccine 09/22/2019  8:19 AM 0.3 mL 06/06/2019 Intramuscular   Manufacturer: [place]   Lot: [id number]   [place]: [id number]

## 2019-11-13 DIAGNOSIS — R7989 Other specified abnormal findings of blood chemistry: Secondary | ICD-10-CM | POA: Insufficient documentation

## 2019-11-13 DIAGNOSIS — R7309 Other abnormal glucose: Secondary | ICD-10-CM | POA: Insufficient documentation

## 2019-11-13 NOTE — Progress Notes (Signed)
Established patient visit  I,Elena D DeSanto,acting as a scribe for [employer], PA.,have documented all relevant documentation on the behalf of Vernie Murders, PA,as directed by  [employer], PA while in the presence of [employer], [place].   Patient: Wayne Holland   DOB: 07-30-1955   64 y.o. Male  MRN: [id number] Visit Date: 11/17/2019  Today's healthcare provider: Vernie Murders, PA   Chief Complaint  Patient presents with  . Hyperlipidemia  . Hyperglycemia  . Elevated Hepatic Enzymes  . Hypertension   Subjective    HPI   The patient is a 64 year old male who presents for follow up to some abnormal labs found on his CPE labwork.  Elevated Glucose- .Patient had slightly elevated glucose of 117  on 05/15/19.  Follow up on 08/18/19 was 105.  He was instructed to follow up today to have HgbA1C obtained  Elevated Liver Enzyme-Patient had elevated ALT of 49 on 05/15/19 and repeat on 08/18/19 was 29.  Abnormal TSH-Patient had low TSH of 0.415 on 05/15/19.  He did not have this done on his follow up labs in February and will need to have this checked today.  Hypercholesterolemia-Patient had elevated LDL of 115 on 05/15/19 with follow up on 08/18/19 and a level of 103.  Patient reports he is doing well.  No issues, concerns or complaints.  He does not need any refills today.  Past Medical History:  Diagnosis Date  . Sleep apnea    Past Surgical History:  Procedure Laterality Date  . COLONOSCOPY  2007  . COLONOSCOPY WITH PROPOFOL N/A 06/28/2016   Procedure: COLONOSCOPY WITH PROPOFOL;  Surgeon: [name], MD;  Location: [facility] ENDOSCOPY;  Service: Endoscopy;  Laterality: N/A;  . NO PAST SURGERIES     Family History  Problem Relation Age of Onset  . Dementia Mother   . Ovarian cancer Mother   . Stroke Father   . Diabetes Father   . Colon cancer Neg Hx    No Known Allergies  .Medications: Outpatient Medications Prior to Visit  Medication Sig  .  lisinopril (ZESTRIL) 5 MG tablet TAKE 1 TABLET BY MOUTH EVERY DAY  . Omega 3 1200 MG CAPS Take 2 capsules by mouth daily.  . pravastatin (PRAVACHOL) 20 MG tablet TAKE 1 TABLET BY MOUTH EVERY DAY  . tamsulosin (FLOMAX) 0.4 MG CAPS capsule Take 1 capsule (0.4 mg total) by mouth daily.   No facility-administered medications prior to visit.    Review of Systems  Constitutional: Negative for chills, fatigue and fever.  Respiratory: Negative for cough, shortness of breath and wheezing.   Cardiovascular: Negative for chest pain, palpitations and leg swelling.  Gastrointestinal: Negative for abdominal pain and diarrhea.  Endocrine: Negative for polydipsia and polyuria.  Neurological: Negative for dizziness and headaches.      Objective    BP 118/70 (BP Location: Right Arm, Patient Position: Sitting, Cuff Size: Normal)   Pulse 63   Temp (!) 96.9 F (36.1 C) (Skin)   Wt 192 lb (87.1 kg)   SpO2 99%   BMI 27.55 kg/m    Physical Exam Constitutional:      General: He is not in acute distress.    Appearance: He is well-developed.  HENT:     Head: Normocephalic and atraumatic.     Right Ear: Hearing normal.     Left Ear: Hearing normal.     Nose: Nose normal.     Mouth/Throat:  Mouth: Mucous membranes are moist.  Eyes:     General: Lids are normal. No scleral icterus.       Right eye: No discharge.        Left eye: No discharge.     Conjunctiva/sclera: Conjunctivae normal.  Cardiovascular:     Rate and Rhythm: Normal rate and regular rhythm.     Heart sounds: Normal heart sounds.  Pulmonary:     Effort: Pulmonary effort is normal. No respiratory distress.  Abdominal:     General: Bowel sounds are normal.     Palpations: Abdomen is soft.  Musculoskeletal:        General: Normal range of motion.     Cervical back: Normal range of motion and neck supple.  Skin:    Findings: No lesion or rash.  Neurological:     Mental Status: He is alert and oriented to person, place, and  time.  Psychiatric:        Speech: Speech normal.        Behavior: Behavior normal.        Thought Content: Thought content normal.       Assessment & Plan    1. Elevated glucose FBS on labs done 08-18-19 was 117. Family history positive for diabetes. No polyuria, polydipsia, vision changes or peripheral neuropathy. Will check Hgb A1C today. Continue low fat diet and regular exercise. Recheck in 6 months pending lab reports. - Hemoglobin A1c  2. Low TSH level TSH low on 08-18-19. No palpitations, tremor, heat intolerance, weight loss or exophthalmos. Recheck TSH. - TSH  3. Essential hypertension Well controlled BP. Tolerating the Lisinopril 5 mg qd without palpitations, edema, cough or chest pains.   4. Pure hypercholesterolemia No muscle or joint pains with use of Pravastatin 20 mg qd. Last labs on 08-18-19 showed LDL only 103, HDL 53 and total cholesterol 173. Continue present regimen.  5. Benign prostatic hyperplasia with urinary obstruction Flomax helping with nocturia control. Average once a night.  6. Obstructive sleep apnea Occasionally using the CPAP. Difficulty tolerating.    No follow-ups on file.      Andres Shad, PA, have reviewed all documentation for this visit. The documentation on 11/17/19 for the exam, diagnosis, procedures, and orders are all accurate and complete.    Vernie Murders, [place] [phone] (phone) [phone] (fax)  [place]

## 2019-11-17 ENCOUNTER — Encounter: Payer: Self-pay | Admitting: Family Medicine

## 2019-11-17 ENCOUNTER — Ambulatory Visit (INDEPENDENT_AMBULATORY_CARE_PROVIDER_SITE_OTHER): Payer: BLUE CROSS/BLUE SHIELD | Admitting: Family Medicine

## 2019-11-17 ENCOUNTER — Other Ambulatory Visit: Payer: Self-pay

## 2019-11-17 DIAGNOSIS — G4733 Obstructive sleep apnea (adult) (pediatric): Secondary | ICD-10-CM

## 2019-11-17 DIAGNOSIS — E7800 Pure hypercholesterolemia, unspecified: Secondary | ICD-10-CM

## 2019-11-17 DIAGNOSIS — R7309 Other abnormal glucose: Secondary | ICD-10-CM

## 2019-11-17 DIAGNOSIS — R7989 Other specified abnormal findings of blood chemistry: Secondary | ICD-10-CM

## 2019-11-17 DIAGNOSIS — I10 Essential (primary) hypertension: Secondary | ICD-10-CM | POA: Diagnosis not present

## 2019-11-17 DIAGNOSIS — N401 Enlarged prostate with lower urinary tract symptoms: Secondary | ICD-10-CM

## 2019-11-17 DIAGNOSIS — N138 Other obstructive and reflux uropathy: Secondary | ICD-10-CM

## 2019-11-18 ENCOUNTER — Telehealth: Payer: Self-pay

## 2019-11-18 LAB — HEMOGLOBIN A1C
Est. average glucose Bld gHb Est-mCnc: 126 mg/dL
Hgb A1c MFr Bld: 6 % — ABNORMAL HIGH (ref 4.8–5.6)

## 2019-11-18 LAB — TSH: TSH: 0.45 u[IU]/mL (ref 0.450–4.500)

## 2019-11-18 NOTE — Telephone Encounter (Signed)
-----   Message from [employer], [place] sent at 11/18/2019  9:23 AM EDT ----- Hgb A1C in the prediabetes range. Thyroid test back to normal range. Recommend watching sweets and lower fats in diet. Should exercise regularly and recheck progress in 3-4 months.

## 2019-11-18 NOTE — Telephone Encounter (Signed)
No answer

## 2019-11-19 NOTE — Telephone Encounter (Signed)
Called pt. Given results and instructions. Verbalizes understanding. Will call back and schedule an appointment.

## 2019-12-22 ENCOUNTER — Other Ambulatory Visit: Payer: Self-pay | Admitting: Family Medicine

## 2019-12-22 DIAGNOSIS — I10 Essential (primary) hypertension: Secondary | ICD-10-CM

## 2019-12-22 NOTE — Telephone Encounter (Signed)
Requested medication (s) are due for refill today: Yes  Requested medication (s) are on the active medication list: Yes  Last refill:  01/17/19  Future visit scheduled: No  Notes to clinic:  Prescription expires next month.    Requested Prescriptions  Pending Prescriptions Disp Refills   lisinopril (ZESTRIL) 5 MG tablet [Pharmacy Med Name: LISINOPRIL 5 MG TABLET] 90 tablet 3    Sig: TAKE 1 TABLET BY MOUTH EVERY DAY      Cardiovascular:  ACE Inhibitors Passed - 12/22/2019  4:19 PM      Passed - Cr in normal range and within 180 days    Creat  Date Value Ref Range Status  05/10/2017 0.91 0.70 - 1.25 mg/dL Final    Comment:    For patients >84 years of age, the reference limit for Creatinine is approximately 13% higher for people identified as African-American. .    Creatinine, Ser  Date Value Ref Range Status  08/18/2019 1.00 0.76 - 1.27 mg/dL Final          Passed - K in normal range and within 180 days    Potassium  Date Value Ref Range Status  08/18/2019 4.4 3.5 - 5.2 mmol/L Final          Passed - Patient is not pregnant      Passed - Last BP in normal range    BP Readings from Last 1 Encounters:  11/17/19 118/70          Passed - Valid encounter within last 6 months    Recent Outpatient Visits           1 month ago Elevated glucose   [place], [place], [place]   6 months ago Epistaxis   [facility] Birdie Sons, MD   7 months ago Annual physical exam   [facility], [place]ickki Muff, [place]   1 year ago Annual physical exam   [place], [place]   1 year ago Infected sebaceous cyst of skin   [facility], Vickki Muff, [place]       Future Appointments             In 2 months [place], Ronda Fairly, [place]

## 2020-02-23 ENCOUNTER — Ambulatory Visit: Payer: BLUE CROSS/BLUE SHIELD | Admitting: Urology

## 2020-02-25 ENCOUNTER — Ambulatory Visit: Payer: Self-pay | Admitting: Urology

## 2020-03-10 ENCOUNTER — Other Ambulatory Visit: Payer: Self-pay

## 2020-03-10 ENCOUNTER — Encounter: Payer: Self-pay | Admitting: Urology

## 2020-03-10 ENCOUNTER — Ambulatory Visit (INDEPENDENT_AMBULATORY_CARE_PROVIDER_SITE_OTHER): Payer: BLUE CROSS/BLUE SHIELD | Admitting: Urology

## 2020-03-10 VITALS — BP 150/95 | HR 65 | Ht 72.0 in | Wt 189.0 lb

## 2020-03-10 DIAGNOSIS — N138 Other obstructive and reflux uropathy: Secondary | ICD-10-CM

## 2020-03-10 DIAGNOSIS — R310 Gross hematuria: Secondary | ICD-10-CM | POA: Diagnosis not present

## 2020-03-10 DIAGNOSIS — N401 Enlarged prostate with lower urinary tract symptoms: Secondary | ICD-10-CM

## 2020-03-10 NOTE — Progress Notes (Signed)
° °  03/10/2020 8:44 AM   Wayne Holland 20-Apr-1956 [id number]  Referring provider: Margo Common, [place] [address] [place],  [place] [address]  Chief Complaint  Patient presents with   Benign Prostatic Hypertrophy    Urologic history: 1.BPH with lower urinary tract symptoms -Previously followed by Dr. Jacqlyn Larsen -Cystoscopy January 2019 with lateral lobe enlargement andbilobar intravesical protrusion -Tamsulosin started January 2019   HPI: 64 y.o. male presents for annual follow-up.   Remains on tamsulosin  No bothersome LUTS  PSA last year stable 1.7   Episode total gross painless hematuria 02/28/2020  Denied dysuria  Hematuria resolved after 12 hours   PMH: Past Medical History:  Diagnosis Date   Sleep apnea     Surgical History: Past Surgical History:  Procedure Laterality Date   COLONOSCOPY  2007   COLONOSCOPY WITH PROPOFOL N/A 06/28/2016   Procedure: COLONOSCOPY WITH PROPOFOL;  Surgeon: [name], MD;  Location: [facility] ENDOSCOPY;  Service: Endoscopy;  Laterality: N/A;   NO PAST SURGERIES      Home Medications:  Allergies as of 03/10/2020   No Known Allergies     Medication List       Accurate as of March 10, 2020  8:44 AM. If you have any questions, ask your nurse or doctor.        lisinopril 5 MG tablet Commonly known as: ZESTRIL TAKE 1 TABLET BY MOUTH EVERY DAY   Omega 3 1200 MG Caps Take 2 capsules by mouth daily.   pravastatin 20 MG tablet Commonly known as: PRAVACHOL TAKE 1 TABLET BY MOUTH EVERY DAY   tamsulosin 0.4 MG Caps capsule Commonly known as: FLOMAX Take 1 capsule (0.4 mg total) by mouth daily.       Allergies: No Known Allergies  Family History: Family History  Problem Relation Age of Onset   Dementia Mother    Ovarian cancer Mother    Stroke Father    Diabetes Father    Colon cancer Neg Hx     Social History:  reports that he has quit smoking. He quit after 4.00 years of use. He  has never used smokeless tobacco. He reports current alcohol use. He reports that he does not use drugs.   Physical Exam: BP (!) 150/95    Pulse 65    Ht 6' (1.829 m)    Wt 189 lb (85.7 kg)    BMI 25.63 kg/m   Constitutional:  Alert and oriented, No acute distress. HEENT: [place] AT, moist mucus membranes.  Trachea midline, no masses. Cardiovascular: No clubbing, cyanosis, or edema. Respiratory: Normal respiratory effort, no increased work of breathing. GU: Prostate 45 g, smooth without nodules Skin: No rashes, bruises or suspicious lesions. Neurologic: Grossly intact, no focal deficits, moving all 4 extremities. Psychiatric: Normal mood and affect.   Assessment & Plan:    1. Benign prostatic hyperplasia with urinary obstruction  Stable  Bladder scan PVR 51 mL  Tamsulosin refilled  He desires to continue prostate cancer screening and PSA ordered  Continue annual follow-up  2.  Total gross painless hematuria  New problem  AUA risk stratification: High  We discussed the standard recommendation for high risk hematuria to include CT urogram and cystoscopy  The procedures were discussed and he elects to proceed    Abbie Sons, MD  [facility] [address] [place], [place] [address] [phone]

## 2020-03-11 ENCOUNTER — Telehealth: Payer: Self-pay | Admitting: *Deleted

## 2020-03-11 LAB — PSA: Prostate Specific Ag, Serum: 1.7 ng/mL (ref 0.0–4.0)

## 2020-03-11 NOTE — Telephone Encounter (Signed)
Notified patient as instructed, patient pleased. Discussed follow-up appointments, patient agrees  

## 2020-03-11 NOTE — Telephone Encounter (Signed)
-----   Message from Abbie Sons, MD sent at 03/11/2020 12:53 PM EDT ----- PSA normal and stable at 1.7

## 2020-03-18 ENCOUNTER — Other Ambulatory Visit: Payer: Self-pay | Admitting: Urology

## 2020-03-18 DIAGNOSIS — N401 Enlarged prostate with lower urinary tract symptoms: Secondary | ICD-10-CM

## 2020-04-06 ENCOUNTER — Ambulatory Visit
Admission: RE | Admit: 2020-04-06 | Discharge: 2020-04-06 | Disposition: A | Discharge status: Home / Self Care | Payer: BLUE CROSS/BLUE SHIELD | Source: Ambulatory Visit | Attending: Urology | Admitting: Urology

## 2020-04-06 ENCOUNTER — Other Ambulatory Visit: Payer: Self-pay

## 2020-04-06 DIAGNOSIS — R310 Gross hematuria: Secondary | ICD-10-CM | POA: Diagnosis not present

## 2020-04-06 LAB — POCT I-STAT CREATININE: Creatinine, Ser: 1 mg/dL (ref 0.61–1.24)

## 2020-04-06 MED ORDER — IOHEXOL 300 MG/ML  SOLN
125.0000 mL | Freq: Once | INTRAMUSCULAR | Status: AC | PRN
  Administered 2020-04-06: 125 mL via INTRAVENOUS

## 2020-04-07 NOTE — Progress Notes (Signed)
° °  04/08/2020  CC:  Chief Complaint  Patient presents with   Cysto   Urologichistory: 1.BPH with lower urinary tract symptoms -Previously followed by Dr. Jacqlyn Larsen -Cystoscopy January 2019 with lateral lobe enlargement andbilobar intravesical protrusion -Tamsulosin started January 2019   HPI: Wayne Holland is a 64 y.o. male who returns for a cystoscopy.    Episode total gross painless hematuria 02/28/2020  Hematuria resolved after 12 hours  CTU on 04/06/2020 revealed a 3 mm right renal calculus. No evidence of ureteral calculi or hydronephrosis. Markedly enlarged prostate, with findings of chronic bladder outlet obstruction. Several bladder calculi measuring up to 8 mm.   Blood pressure 134/85, pulse 85, height 5\' 10"  (1.778 m), weight 189 lb (85.7 kg). NED. A&Ox3.   No respiratory distress   Abd soft, NT, ND Normal phallus with bilateral descended testicles  Mr. Tomasetti was scheduled for cystoscopy today however wanted to discuss CT results. Denies recurrent gross hematuria. CT urogram performed 04/06/2020 remarkable for a 3 mm nonobstructing right lower pole calculus. Several bladder calculi were present the largest measuring 8 mm. Prostate gland significantly enlarged with intravesical protrusion.  Volume estimated at 166 cc  Assessment/ Plan:  1.  Gross hematuria  Most likely secondary to BPH/bladder calculi  2.  Marked BPH  Mild to moderate symptoms.  Recent bladder scan PVR 51 mL  3.  Bladder calculi  Based on prostate size he may not be able to pass these calculi.  We discussed options of cystolitholapaxy and observation.  If he elected cystolitholapaxy he will also need side if he desired an outlet procedure and based on prostate size we discussed HoLEP and simple prostatectomy which could be done laparoscopically  He states his insurance does not accept the [facility] that work and would like [facility] referral which is covered.  Referral will be sent and he will  discuss options further with [facility].    Fransico Him, am acting as a scribe for Dr. Nicki Reaper C. Chauna Osoria,  I have reviewed the above documentation for accuracy and completeness, and I agree with the above.   Abbie Sons, MD

## 2020-04-08 ENCOUNTER — Encounter: Payer: Self-pay | Admitting: Urology

## 2020-04-08 ENCOUNTER — Other Ambulatory Visit: Payer: Self-pay

## 2020-04-08 ENCOUNTER — Ambulatory Visit (INDEPENDENT_AMBULATORY_CARE_PROVIDER_SITE_OTHER): Payer: BLUE CROSS/BLUE SHIELD | Admitting: Urology

## 2020-04-08 VITALS — BP 134/85 | HR 85 | Ht 70.0 in | Wt 189.0 lb

## 2020-04-08 DIAGNOSIS — N210 Calculus in bladder: Secondary | ICD-10-CM | POA: Diagnosis not present

## 2020-04-08 DIAGNOSIS — R310 Gross hematuria: Secondary | ICD-10-CM

## 2020-04-08 DIAGNOSIS — N401 Enlarged prostate with lower urinary tract symptoms: Secondary | ICD-10-CM | POA: Diagnosis not present

## 2020-04-08 LAB — URINALYSIS, COMPLETE
Bilirubin, UA: NEGATIVE
Ketones, UA: NEGATIVE
Leukocytes,UA: NEGATIVE
Nitrite, UA: NEGATIVE
Specific Gravity, UA: >1.03 — ABNORMAL HIGH (ref 1.005–1.030)
Urobilinogen, Ur: 0.2 mg/dL (ref 0.2–1.0)
pH, UA: 5 (ref 5.0–7.5)

## 2020-04-08 LAB — MICROSCOPIC EXAMINATION

## 2020-04-08 MED ORDER — LIDOCAINE HCL URETHRAL/MUCOSAL 2 % EX GEL: 1.0000 "application " | Freq: Once | CUTANEOUS | Status: DC

## 2020-04-11 ENCOUNTER — Encounter: Payer: Self-pay | Admitting: Urology

## 2020-04-19 ENCOUNTER — Ambulatory Visit (INDEPENDENT_AMBULATORY_CARE_PROVIDER_SITE_OTHER): Payer: BLUE CROSS/BLUE SHIELD | Admitting: Family Medicine

## 2020-04-19 ENCOUNTER — Encounter: Payer: Self-pay | Admitting: Family Medicine

## 2020-04-19 ENCOUNTER — Other Ambulatory Visit: Payer: Self-pay

## 2020-04-19 VITALS — BP 118/75 | HR 59 | Temp 98.4°F | Resp 16 | Wt 184.6 lb

## 2020-04-19 DIAGNOSIS — R7989 Other specified abnormal findings of blood chemistry: Secondary | ICD-10-CM | POA: Diagnosis not present

## 2020-04-19 DIAGNOSIS — R7309 Other abnormal glucose: Secondary | ICD-10-CM | POA: Diagnosis not present

## 2020-04-19 DIAGNOSIS — E7800 Pure hypercholesterolemia, unspecified: Secondary | ICD-10-CM

## 2020-04-19 DIAGNOSIS — I10 Essential (primary) hypertension: Secondary | ICD-10-CM | POA: Diagnosis not present

## 2020-04-19 NOTE — Progress Notes (Signed)
Established patient visit   Patient: Wayne Holland   DOB: 07/14/1955   64 y.o. Male  MRN: [id number] Visit Date: 04/19/2020  Today's healthcare provider: Vernie Murders, PA   Chief Complaint  Patient presents with  . Hyperlipidemia  . Hypertension  . Hyperglycemia   Subjective    HPI  Hypertension, follow-up  BP Readings from Last 3 Encounters:  04/19/20 118/75  04/08/20 134/85  03/10/20 (!) 150/95   Wt Readings from Last 3 Encounters:  04/19/20 184 lb 9.6 oz (83.7 kg)  04/08/20 189 lb (85.7 kg)  03/10/20 189 lb (85.7 kg)     He was last seen for hypertension 5 months ago.  BP at that visit was 118/70. Management since that visit includes none.  He reports excellent compliance with treatment. He is not having side effects.  He is following a Regular diet. He is exercising. He does not smoke.  Use of agents associated with hypertension: none.   Outside blood pressures are not being checked. Symptoms: No chest pain No chest pressure  No palpitations No syncope  No dyspnea No orthopnea  No paroxysmal nocturnal dyspnea No lower extremity edema   Pertinent labs: Lab Results  Component Value Date   CHOL 173 08/18/2019   HDL 53 08/18/2019   LDLCALC 103 (H) 08/18/2019   TRIG 90 08/18/2019   CHOLHDL 3.3 08/18/2019   Lab Results  Component Value Date   NA 140 08/18/2019   K 4.4 08/18/2019   CREATININE 1.00 04/06/2020   GFRNONAA 80 08/18/2019   GFRAA 92 08/18/2019   GLUCOSE 117 (H) 08/18/2019     The 10-year ASCVD risk score Mikey Bussing DC Jr., et al., 2013) is: 10.4%   --------------------------------------------------------------------------------------------------- Lipid/Cholesterol, Follow-up  Last lipid panel Other pertinent labs  Lab Results  Component Value Date   CHOL 173 08/18/2019   HDL 53 08/18/2019   LDLCALC 103 (H) 08/18/2019   TRIG 90 08/18/2019   CHOLHDL 3.3 08/18/2019   Lab Results  Component Value Date   ALT 29 08/18/2019    AST 21 08/18/2019   PLT 258 05/15/2019   TSH 0.450 11/17/2019     He was last seen for this 5 months ago.  Management since that visit includes none.  He reports excellent compliance with treatment. He is not having side effects.   Symptoms: No chest pain No chest pressure/discomfort  No dyspnea No lower extremity edema  No numbness or tingling of extremity No orthopnea  No palpitations No paroxysmal nocturnal dyspnea  No speech difficulty No syncope   Current diet: in general, a "healthy" diet   Current exercise: walking  The 10-year ASCVD risk score Mikey Bussing DC Jr., et al., 2013) is: 10.4%  --------------------------------------------------------------------------------------------------- Follow up for Elevated Glucose  The patient was last seen for this 5 months ago. Changes made at last visit include working on low fat diet and exercise. patient denies symptoms of polydipsia, polyuria, increased appetite or visual changes  -----------------------------------------------------------------------------------------  Patient Active Problem List   Diagnosis Date Noted  . Gross hematuria 03/10/2020  . Elevated glucose 11/13/2019  . Low TSH level 11/13/2019  . Essential hypertension 08/11/2016  . Encounter for screening colonoscopy 05/24/2016  . Pure hypercholesterolemia 03/19/2015  . Obstructive sleep apnea 03/19/2015  . Snoring 03/19/2015  . Tendinitis of right shoulder 03/19/2015  . Incomplete bladder emptying 06/10/2012  . Benign prostatic hyperplasia with urinary obstruction 06/10/2012  . Nodular prostate with urinary obstruction 06/10/2012   Past Medical History:  Diagnosis Date  . Sleep apnea    Past Surgical History:  Procedure Laterality Date  . COLONOSCOPY  2007  . COLONOSCOPY WITH PROPOFOL N/A 06/28/2016   Procedure: COLONOSCOPY WITH PROPOFOL;  Surgeon: [name], MD;  Location: [facility] ENDOSCOPY;  Service: Endoscopy;  Laterality: N/A;  . NO PAST  SURGERIES     Family History  Problem Relation Age of Onset  . Dementia Mother   . Ovarian cancer Mother   . Stroke Father   . Diabetes Father   . Colon cancer Neg Hx     No Known Allergies     Medications: Outpatient Medications Prior to Visit  Medication Sig  . lisinopril (ZESTRIL) 5 MG tablet TAKE 1 TABLET BY MOUTH EVERY DAY  . Omega 3 1200 MG CAPS Take 2 capsules by mouth daily.  . pravastatin (PRAVACHOL) 20 MG tablet TAKE 1 TABLET BY MOUTH EVERY DAY  . tamsulosin (FLOMAX) 0.4 MG CAPS capsule TAKE 1 CAPSULE BY MOUTH EVERY DAY   No facility-administered medications prior to visit.    Review of Systems  Constitutional: Negative.   HENT: Negative.   Respiratory: Negative.   Cardiovascular: Negative.   Gastrointestinal: Negative.   Musculoskeletal: Positive for back pain.       Low mid back pain intermittently with history of kidney and bladder stones.      Objective    BP 118/75   Pulse (!) 59   Temp 98.4 F (36.9 C) (Oral)   Resp 16   Wt 184 lb 9.6 oz (83.7 kg)   SpO2 98%   BMI 26.49 kg/m  BP Readings from Last 3 Encounters:  04/19/20 118/75  04/08/20 134/85  03/10/20 (!) 150/95   Wt Readings from Last 3 Encounters:  04/19/20 184 lb 9.6 oz (83.7 kg)  04/08/20 189 lb (85.7 kg)  03/10/20 189 lb (85.7 kg)     Physical Exam    No results found for any visits on 04/19/20.  Assessment & Plan     1. Essential hypertension Continues Lisinopril 5 mg qd and restricting salt intake. Recheck Lipid Panel, CMP and TSH. Follow up appointment pending lab reports. - Lipid Panel With LDL/HDL Ratio - Comprehensive metabolic panel - TSH  2. Elevated glucose Hgb A1C was 6.0% on 11-17-19. No polyuria, polydipsia or vision disturbance. Recheck labs and follow up pending reports. - Hemoglobin A1c - Comprehensive metabolic panel  3. Low TSH level TSH was a little low a year ago. No exophthalmos, pretibial edema, palpitations or intolerance to heat or cold. Recheck  TSH. - TSH  4. Pure hypercholesterolemia Tolerating the Pravastatin 20 mg qd with Omega-3 1200 mg qd. Proceed with low fat diet and exercise regularly. Recheck labs. - Lipid Panel With LDL/HDL Ratio - Comprehensive metabolic panel - TSH   No follow-ups on file.     Andres Shad, PA, have reviewed all documentation for this visit. The documentation on 04/19/20 for the exam, diagnosis, procedures, and orders are all accurate and complete.     Vernie Murders, [place] [phone] (phone) [phone] (fax)  [place]

## 2020-04-20 LAB — COMPREHENSIVE METABOLIC PANEL
ALT: 48 IU/L — ABNORMAL HIGH (ref 0–44)
AST: 27 IU/L (ref 0–40)
Albumin/Globulin Ratio: 2 (ref 1.2–2.2)
Albumin: 4.7 g/dL (ref 3.8–4.8)
Alkaline Phosphatase: 79 IU/L (ref 44–121)
BUN/Creatinine Ratio: 10 (ref 10–24)
BUN: 10 mg/dL (ref 8–27)
Bilirubin Total: 1 mg/dL (ref 0.0–1.2)
CO2: 22 mmol/L (ref 20–29)
Calcium: 9.4 mg/dL (ref 8.6–10.2)
Chloride: 101 mmol/L (ref 96–106)
Creatinine, Ser: 1.01 mg/dL (ref 0.76–1.27)
GFR calc Af Amer: 90 mL/min/{1.73_m2} (ref >59)
GFR calc non Af Amer: 78 mL/min/{1.73_m2} (ref >59)
Globulin, Total: 2.3 g/dL (ref 1.5–4.5)
Glucose: 94 mg/dL (ref 65–99)
Potassium: 4.5 mmol/L (ref 3.5–5.2)
Sodium: 139 mmol/L (ref 134–144)
Total Protein: 7 g/dL (ref 6.0–8.5)

## 2020-04-20 LAB — LIPID PANEL WITH LDL/HDL RATIO
Cholesterol, Total: 150 mg/dL (ref 100–199)
HDL: 58 mg/dL (ref >39)
LDL Chol Calc (NIH): 80 mg/dL (ref 0–99)
LDL/HDL Ratio: 1.4 ratio (ref 0.0–3.6)
Triglycerides: 60 mg/dL (ref 0–149)
VLDL Cholesterol Cal: 12 mg/dL (ref 5–40)

## 2020-04-20 LAB — HEMOGLOBIN A1C
Est. average glucose Bld gHb Est-mCnc: 126 mg/dL
Hgb A1c MFr Bld: 6 % — ABNORMAL HIGH (ref 4.8–5.6)

## 2020-04-20 LAB — TSH: TSH: 0.276 u[IU]/mL — ABNORMAL LOW (ref 0.450–4.500)

## 2020-05-11 ENCOUNTER — Other Ambulatory Visit: Payer: Self-pay | Admitting: Family Medicine

## 2020-05-11 DIAGNOSIS — E7800 Pure hypercholesterolemia, unspecified: Secondary | ICD-10-CM

## 2020-07-20 ENCOUNTER — Encounter: Payer: Self-pay | Admitting: Family Medicine

## 2020-07-22 ENCOUNTER — Ambulatory Visit (INDEPENDENT_AMBULATORY_CARE_PROVIDER_SITE_OTHER): Payer: 59 | Admitting: Family Medicine

## 2020-07-22 ENCOUNTER — Ambulatory Visit
Admission: RE | Admit: 2020-07-22 | Discharge: 2020-07-22 | Disposition: A | Discharge status: Home / Self Care | Payer: 59 | Source: Ambulatory Visit | Attending: Family Medicine | Admitting: Family Medicine

## 2020-07-22 ENCOUNTER — Ambulatory Visit
Admission: RE | Admit: 2020-07-22 | Discharge: 2020-07-22 | Disposition: A | Discharge status: Home / Self Care | Payer: 59 | Attending: Family Medicine | Admitting: Family Medicine

## 2020-07-22 ENCOUNTER — Encounter: Payer: Self-pay | Admitting: Family Medicine

## 2020-07-22 ENCOUNTER — Other Ambulatory Visit: Payer: Self-pay

## 2020-07-22 VITALS — BP 102/72 | HR 69 | Temp 98.1°F | Wt 182.0 lb

## 2020-07-22 DIAGNOSIS — M79674 Pain in right toe(s): Secondary | ICD-10-CM | POA: Diagnosis not present

## 2020-07-22 NOTE — Progress Notes (Signed)
Acute Office Visit  Subjective:    Patient ID: Wayne Holland, male    DOB: 1956/01/07, 65 y.o.   MRN: [id number]  No chief complaint on file.   HPI Patient is in today for right foot pain and swelling.  He states that the symptoms began about 1 week ago.  He had redness as well.  Today the symptoms are somewhat improved.  He states there has been no known trauma and he has no history of gout.  Past Medical History:  Diagnosis Date  . Sleep apnea     Past Surgical History:  Procedure Laterality Date  . COLONOSCOPY  2007  . COLONOSCOPY WITH PROPOFOL N/A 06/28/2016   Procedure: COLONOSCOPY WITH PROPOFOL;  Surgeon: [name], MD;  Location: [facility] ENDOSCOPY;  Service: Endoscopy;  Laterality: N/A;  . NO PAST SURGERIES      Family History  Problem Relation Age of Onset  . Dementia Mother   . Ovarian cancer Mother   . Stroke Father   . Diabetes Father   . Colon cancer Neg Hx     Social History   Socioeconomic History  . Marital status: Married    Spouse name: Not on file  . Number of children: Not on file  . Years of education: Not on file  . Highest education level: Not on file  Occupational History  . Not on file  Tobacco Use  . Smoking status: Former Smoker    Years: 4.00  . Smokeless tobacco: Never Used  . Tobacco comment: QUIT IN 1970'S  Substance and Sexual Activity  . Alcohol use: Yes    Alcohol/week: 0.0 standard drinks    Comment: OCCASIONALLY  . Drug use: No  . Sexual activity: Yes    Birth control/protection: None  Other Topics Concern  . Not on file  Social History Narrative  . Not on file   Social Determinants of Health   Financial Resource Strain: Not on file  Food Insecurity: Not on file  Transportation Needs: Not on file  Physical Activity: Not on file  Stress: Not on file  Social Connections: Not on file  Intimate Partner Violence: Not on file    Outpatient Medications Prior to Visit  Medication Sig Dispense Refill  .  lisinopril (ZESTRIL) 5 MG tablet TAKE 1 TABLET BY MOUTH EVERY DAY 90 tablet 3  . Omega 3 1200 MG CAPS Take 2 capsules by mouth daily.    . pravastatin (PRAVACHOL) 20 MG tablet TAKE 1 TABLET BY MOUTH EVERY DAY 90 tablet 1  . tamsulosin (FLOMAX) 0.4 MG CAPS capsule TAKE 1 CAPSULE BY MOUTH EVERY DAY 90 capsule 3   No facility-administered medications prior to visit.    No Known Allergies  Review of Systems  Musculoskeletal: Positive for arthralgias, gait problem and joint swelling.      Objective:    Physical Exam  BP 102/72 (BP Location: Right Arm, Patient Position: Sitting, Cuff Size: Normal)   Pulse 69   Temp 98.1 F (36.7 C) (Oral)   Wt 182 lb (82.6 kg)   SpO2 100%   BMI 26.11 kg/m  Wt Readings from Last 3 Encounters:  07/22/20 182 lb (82.6 kg)  04/19/20 184 lb 9.6 oz (83.7 kg)  04/08/20 189 lb (85.7 kg)    Lab Results  Component Value Date   TSH 0.276 (L) 04/19/2020   Lab Results  Component Value Date   WBC 5.4 05/15/2019   HGB 16.3 05/15/2019   HCT 45.2 05/15/2019  MCV 89 05/15/2019   PLT 258 05/15/2019   Lab Results  Component Value Date   NA 139 04/19/2020   K 4.5 04/19/2020   CO2 22 04/19/2020   GLUCOSE 94 04/19/2020   BUN 10 04/19/2020   CREATININE 1.01 04/19/2020   BILITOT 1.0 04/19/2020   ALKPHOS 79 04/19/2020   AST 27 04/19/2020   ALT 48 (H) 04/19/2020   PROT 7.0 04/19/2020   ALBUMIN 4.7 04/19/2020   CALCIUM 9.4 04/19/2020   Lab Results  Component Value Date   CHOL 150 04/19/2020   Lab Results  Component Value Date   HDL 58 04/19/2020   Lab Results  Component Value Date   LDLCALC 80 04/19/2020   Lab Results  Component Value Date   TRIG 60 04/19/2020   Lab Results  Component Value Date   CHOLHDL 3.3 08/18/2019   Lab Results  Component Value Date   HGBA1C 6.0 (H) 04/19/2020       Assessment & Plan:     No orders of the defined types were placed in this encounter.    Juluis Mire, CMA   I, Vernie Murders,  PA-C, have reviewed all documentation for this visit. The documentation on 07/22/20 for the exam, diagnosis, procedures, and orders are all accurate and complete.

## 2020-07-22 NOTE — Patient Instructions (Signed)
Low-Purine Eating Plan A low-purine eating plan involves making food choices to limit your intake of purine. Purine is a kind of uric acid. Too much uric acid in your blood can cause certain conditions, such as gout and kidney stones. Eating a low-purine diet can help control these conditions. What are tips for following this plan? Reading food labels  Avoid foods with saturated or Trans fat.  Check the ingredient list of grains-based foods, such as bread and cereal, to make sure that they contain whole grains.  Check the ingredient list of sauces or soups to make sure they do not contain meat or fish.  When choosing soft drinks, check the ingredient list to make sure they do not contain high-fructose corn syrup. Shopping  Buy plenty of fresh fruits and vegetables.  Avoid buying canned or fresh fish.  Buy dairy products labeled as low-fat or nonfat.  Avoid buying premade or processed foods. These foods are often high in fat, salt (sodium), and added sugar.   Cooking  Use olive oil instead of butter when cooking. Oils like olive oil, canola oil, and sunflower oil contain healthy fats. Meal planning  Learn which foods do or do not affect you. If you find out that a food tends to cause your gout symptoms to flare up, avoid eating that food. You can enjoy foods that do not cause problems. If you have any questions about a food item, talk with your dietitian or health care provider.  Limit foods high in fat, especially saturated fat. Fat makes it harder for your body to get rid of uric acid.  Choose foods that are lower in fat and are lean sources of protein. General guidelines  Limit alcohol intake to no more than 1 drink a day for nonpregnant women and 2 drinks a day for men. One drink equals 12 oz of beer, 5 oz of wine, or 1 oz of hard liquor. Alcohol can affect the way your body gets rid of uric acid.  Drink plenty of water to keep your urine clear or pale yellow. Fluids can help  remove uric acid from your body.  If directed by your health care provider, take a vitamin C supplement.  Work with your health care provider and dietitian to develop a plan to achieve or maintain a healthy weight. Losing weight can help reduce uric acid in your blood. What foods are recommended? The items listed may not be a complete list. Talk with your dietitian about what dietary choices are best for you. Foods low in purines Foods low in purines do not need to be limited. These include:  All fruits.  All low-purine vegetables, pickles, and olives.  Breads, pasta, rice, cornbread, and popcorn. Cake and other baked goods.  All dairy foods.  Eggs, nuts, and nut butters.  Spices and condiments, such as salt, herbs, and vinegar.  Plant oils, butter, and margarine.  Water, sugar-free soft drinks, tea, coffee, and cocoa.  Vegetable-based soups, broths, sauces, and gravies. Foods moderate in purines Foods moderate in purines should be limited to the amounts listed.   cup of asparagus, cauliflower, spinach, mushrooms, or green peas, each day.  2/3 cup uncooked oatmeal, each day.   cup dry wheat bran or wheat germ, each day.  2-3 ounces of meat or poultry, each day.  4-6 ounces of shellfish, such as crab, lobster, oysters, or shrimp, each day.  1 cup cooked beans, peas, or lentils, each day.  Soup, broths, or bouillon made from meat  or fish. Limit these foods as much as possible. What foods are not recommended? The items listed may not be a complete list. Talk with your dietitian about what dietary choices are best for you. Limit your intake of foods high in purines, including:  Beer and other alcohol.  Meat-based gravy or sauce.  Canned or fresh fish, such as: ? Anchovies, sardines, herring, and tuna. ? Mussels and scallops. ? Codfish, trout, and haddock.  Berniece Salines.  Organ meats, such as: ? Liver or kidney. ? Tripe. ? Sweetbreads (thymus gland or  pancreas).  Wild [employer] biochemist.  Yeast or yeast extract supplements.  Drinks sweetened with high-fructose corn syrup. Summary  Eating a low-purine diet can help control conditions caused by too much uric acid in the body, such as gout or kidney stones.  Choose low-purine foods, limit alcohol, and limit foods high in fat.  You will learn over time which foods do or do not affect you. If you find out that a food tends to cause your gout symptoms to flare up, avoid eating that food. This information is not intended to replace advice given to you by your health care provider. Make sure you discuss any questions you have with your health care provider. Document Revised: 09/25/2019 Document Reviewed: 09/25/2019 Elsevier Patient Education  2021 [employer]. Gout  Gout is a condition that causes painful swelling of the joints. Gout is a type of inflammation of the joints (arthritis). This condition is caused by having too much uric acid in the body. Uric acid is a chemical that forms when the body breaks down substances called purines. Purines are important for building body proteins. When the body has too much uric acid, sharp crystals can form and build up inside the joints. This causes pain and swelling. Gout attacks can happen quickly and may be very painful (acute gout). Over time, the attacks can affect more joints and become more frequent (chronic gout). Gout can also cause uric acid to build up under the skin and inside the kidneys. What are the causes? This condition is caused by too much uric acid in your blood. This can happen because:  Your kidneys do not remove enough uric acid from your blood. This is the most common cause.  Your body makes too much uric acid. This can happen with some cancers and cancer treatments. It can also occur if your body is breaking down too many red blood cells (hemolytic anemia).  You eat too many foods that are high in purines. These foods include  organ meats and some seafood. Alcohol, especially beer, is also high in purines. A gout attack may be triggered by trauma or stress. What increases the risk? You are more likely to develop this condition if you:  Have a family history of gout.  Are male and middle-aged.  Are male and have gone through menopause.  Are obese.  Frequently drink alcohol, especially beer.  Are dehydrated.  Lose weight too quickly.  Have an organ transplant.  Have lead poisoning.  Take certain medicines, including aspirin, cyclosporine, diuretics, levodopa, and niacin.  Have kidney disease.  Have a skin condition called psoriasis. What are the signs or symptoms? An attack of acute gout happens quickly. It usually occurs in just one joint. The most common place is the big toe. Attacks often start at night. Other joints that may be affected include joints of the feet, ankle, knee, fingers, wrist, or elbow. Symptoms of this condition may include:  Severe  pain.  Warmth.  Swelling.  Stiffness.  Tenderness. The affected joint may be very painful to touch.  Shiny, red, or purple skin.  Chills and fever. Chronic gout may cause symptoms more frequently. More joints may be involved. You may also have white or yellow lumps (tophi) on your hands or feet or in other areas near your joints.   How is this diagnosed? This condition is diagnosed based on your symptoms, medical history, and physical exam. You may have tests, such as:  Blood tests to measure uric acid levels.  Removal of joint fluid with a thin needle (aspiration) to look for uric acid crystals.  X-rays to look for joint damage. How is this treated? Treatment for this condition has two phases: treating an acute attack and preventing future attacks. Acute gout treatment may include medicines to reduce pain and swelling, including:  NSAIDs.  Steroids. These are strong anti-inflammatory medicines that can be taken by mouth (orally) or  injected into a joint.  Colchicine. This medicine relieves pain and swelling when it is taken soon after an attack. It can be given by mouth or through an IV. Preventive treatment may include:  Daily use of smaller doses of NSAIDs or colchicine.  Use of a medicine that reduces uric acid levels in your blood.  Changes to your diet. You may need to see a dietitian about what to eat and drink to prevent gout. Follow these instructions at home: During a gout attack  If directed, put ice on the affected area: ? Put ice in a plastic bag. ? Place a towel between your skin and the bag. ? Leave the ice on for 20 minutes, 2-3 times a day.  Raise (elevate) the affected joint above the level of your heart as often as possible.  Rest the joint as much as possible. If the affected joint is in your leg, you may be given crutches to use.  Follow instructions from your health care provider about eating or drinking restrictions.   Avoiding future gout attacks  Follow a low-purine diet as told by your dietitian or health care provider. Avoid foods and drinks that are high in purines, including liver, kidney, anchovies, asparagus, herring, mushrooms, mussels, and beer.  Maintain a healthy weight or lose weight if you are overweight. If you want to lose weight, talk with your health care provider. It is important that you do not lose weight too quickly.  Start or maintain an exercise program as told by your health care provider. Eating and drinking  Drink enough fluids to keep your urine pale yellow.  If you drink alcohol: ? Limit how much you use to:  0-1 drink a day for women.  0-2 drinks a day for men. ? Be aware of how much alcohol is in your drink. In the [place] one drink equals one 12 oz bottle of beer (355 mL) one 5 oz glass of wine (148 mL), or one 1 oz glass of hard liquor (44 mL). General instructions  Take over-the-counter and prescription medicines only as told by your health care  provider.  Do not drive or use heavy machinery while taking prescription pain medicine.  Return to your normal activities as told by your health care provider. Ask your health care provider what activities are safe for you.  Keep all follow-up visits as told by your health care provider. This is important. Contact a health care provider if you have:  Another gout attack.  Continuing symptoms of a gout  attack after 10 days of treatment.  Side effects from your medicines.  Chills or a fever.  Burning pain when you urinate.  Pain in your lower back or belly. Get help right away if you:  Have severe or uncontrolled pain.  Cannot urinate. Summary  Gout is painful swelling of the joints caused by inflammation.  The most common site of pain is the big toe, but it can affect other joints in the body.  Medicines and dietary changes can help to prevent and treat gout attacks. This information is not intended to replace advice given to you by your health care provider. Make sure you discuss any questions you have with your health care provider. Document Revised: 01/02/2018 Document Reviewed: 01/02/2018 Elsevier Patient Education  [place].

## 2020-07-23 LAB — CBC WITH DIFFERENTIAL/PLATELET
Basophils Absolute: 0 10*3/uL (ref 0.0–0.2)
Basos: 0 %
EOS (ABSOLUTE): 0.2 10*3/uL (ref 0.0–0.4)
Eos: 3 %
Hematocrit: 44.6 % (ref 37.5–51.0)
Hemoglobin: 15.8 g/dL (ref 13.0–17.7)
Immature Grans (Abs): 0 10*3/uL (ref 0.0–0.1)
Immature Granulocytes: 0 %
Lymphocytes Absolute: 1.3 10*3/uL (ref 0.7–3.1)
Lymphs: 15 %
MCH: 31.7 pg (ref 26.6–33.0)
MCHC: 35.4 g/dL (ref 31.5–35.7)
MCV: 90 fL (ref 79–97)
Monocytes Absolute: 0.7 10*3/uL (ref 0.1–0.9)
Monocytes: 8 %
Neutrophils Absolute: 6.6 10*3/uL (ref 1.4–7.0)
Neutrophils: 74 %
Platelets: 249 10*3/uL (ref 150–450)
RBC: 4.98 x10E6/uL (ref 4.14–5.80)
RDW: 11 % — ABNORMAL LOW (ref 11.6–15.4)
WBC: 8.9 10*3/uL (ref 3.4–10.8)

## 2020-07-23 LAB — URIC ACID: Uric Acid: 3.8 mg/dL (ref 3.8–8.4)

## 2020-09-11 ENCOUNTER — Other Ambulatory Visit: Payer: Self-pay | Admitting: Family Medicine

## 2020-09-11 DIAGNOSIS — E7800 Pure hypercholesterolemia, unspecified: Secondary | ICD-10-CM

## 2020-09-11 NOTE — Telephone Encounter (Signed)
Requested Prescriptions  Pending Prescriptions Disp Refills  . pravastatin (PRAVACHOL) 20 MG tablet [Pharmacy Med Name: PRAVASTATIN SODIUM 20 MG TAB] 90 tablet 1    Sig: TAKE 1 TABLET BY MOUTH EVERY DAY     Cardiovascular:  Antilipid - Statins Failed - 09/11/2020 10:34 AM      Failed - LDL in normal range and within 360 days    LDL Cholesterol (Calc)  Date Value Ref Range Status  05/10/2017 89 mg/dL (calc) Final    Comment:    Reference range: <100 . Desirable range <100 mg/dL for primary prevention;   <70 mg/dL for patients with CHD or diabetic patients  with > or = 2 CHD risk factors. Marland Kitchen LDL-C is now calculated using the Martin-Hopkins  calculation, which is a validated novel method providing  better accuracy than the Friedewald equation in the  estimation of LDL-C.  Cresenciano Genre et al. Annamaria Helling. 2297;989(21): 2061-2068  (http://education.[facility].com/faq/FAQ164)    LDL Chol Calc (NIH)  Date Value Ref Range Status  04/19/2020 80 0 - 99 mg/dL Final         Passed - Total Cholesterol in normal range and within 360 days    Cholesterol, Total  Date Value Ref Range Status  04/19/2020 150 100 - 199 mg/dL Final         Passed - HDL in normal range and within 360 days    HDL  Date Value Ref Range Status  04/19/2020 58 >39 mg/dL Final         Passed - Triglycerides in normal range and within 360 days    Triglycerides  Date Value Ref Range Status  04/19/2020 60 0 - 149 mg/dL Final         Passed - Patient is not pregnant      Passed - Valid encounter within last 12 months    Recent Outpatient Visits          1 month ago Pain of great toe, right   [facility], Vickki Muff, PA-C   4 months ago Essential hypertension   [facility], Vickki Muff, PA-C   9 months ago Elevated glucose   [facility], Vickki Muff, PA-C   1 year ago Epistaxis   [facility] Birdie Sons, MD   1 year ago Annual  physical exam   [place], PA-C      Future Appointments            In 1 month [place], Vickki Muff, [place], [place]

## 2020-10-18 NOTE — Progress Notes (Signed)
Established patient visit   Patient: Wayne Holland   DOB: 11-27-1955   65 y.o. Male  MRN: [id number] Visit Date: 10/19/2020  Today's healthcare provider: Laurita Quint Daved Mcfann, FNP   Chief Complaint  Patient presents with  . Hypertension  . Hyperlipidemia  . Hyperglycemia   Subjective    HPI  Hypertension, follow-up  BP Readings from Last 3 Encounters:  10/19/20 104/71  07/22/20 102/72  04/19/20 118/75   Wt Readings from Last 3 Encounters:  10/19/20 171 lb (77.6 kg)  07/22/20 182 lb (82.6 kg)  04/19/20 184 lb 9.6 oz (83.7 kg)     He was last seen for hypertension 6 months ago.  BP at that visit was 118/75. Management since that visit includes no changes.  He reports excellent compliance with treatment. He is not having side effects.  He is following a Regular diet. He is exercising. He does not smoke.  Use of agents associated with hypertension: none.   Outside blood pressures are not being checked at home. Symptoms: No chest pain No chest pressure  No palpitations No syncope  No dyspnea No orthopnea  No paroxysmal nocturnal dyspnea No lower extremity edema   Pertinent labs: Lab Results  Component Value Date   CHOL 150 04/19/2020   HDL 58 04/19/2020   LDLCALC 80 04/19/2020   TRIG 60 04/19/2020   CHOLHDL 3.3 08/18/2019   Lab Results  Component Value Date   NA 139 04/19/2020   K 4.5 04/19/2020   CREATININE 1.01 04/19/2020   GFRNONAA 78 04/19/2020   GFRAA 90 04/19/2020   GLUCOSE 94 04/19/2020     The 10-year ASCVD risk score Mikey Bussing DC Jr., et al., 2013) is: 7.1%   --------------------------------------------------------------------------------------------------- Lipid/Cholesterol, Follow-up  Last lipid panel Other pertinent labs  Lab Results  Component Value Date   CHOL 150 04/19/2020   HDL 58 04/19/2020   LDLCALC 80 04/19/2020   TRIG 60 04/19/2020   CHOLHDL 3.3 08/18/2019   Lab Results  Component Value Date   ALT 48 (H) 04/19/2020    AST 27 04/19/2020   PLT 249 07/22/2020   TSH 0.276 (L) 04/19/2020     He was last seen for this 6 months ago.  Management since that visit includes no changes.  He reports excellent compliance with treatment. He is not having side effects.   Symptoms: No chest pain No chest pressure/discomfort  No dyspnea No lower extremity edema  No numbness or tingling of extremity No orthopnea  No palpitations No paroxysmal nocturnal dyspnea  No speech difficulty No syncope   Current diet: in general, a "healthy" diet   The 10-year ASCVD risk score Mikey Bussing DC Jr., et al., 2013) is: 7.1%  ---------------------------------------------------------------------------------------------------  Prediabetes, Follow-up  Lab Results  Component Value Date   HGBA1C 6.5 (A) 10/19/2020   HGBA1C 6.0 (H) 04/19/2020   HGBA1C 6.0 (H) 11/17/2019   GLUCOSE 94 04/19/2020   GLUCOSE 117 (H) 08/18/2019   GLUCOSE 105 (H) 05/15/2019    Last seen for for this6 months ago.  Management since that visit includes no changes. Current symptoms include none and have been stable.  Has been trying to cut out sugar Walks most days Will hold off starting metormin and see a1c in 3 months  Pertinent Labs:    Component Value Date/Time   CHOL 150 04/19/2020 1359   TRIG 60 04/19/2020 1359   CHOLHDL 3.3 08/18/2019 1008   CHOLHDL 2.7 05/10/2017 1030   CREATININE 1.01 04/19/2020 1359  CREATININE 0.91 05/10/2017 1030    Wt Readings from Last 3 Encounters:  10/19/20 171 lb (77.6 kg)  07/22/20 182 lb (82.6 kg)  04/19/20 184 lb 9.6 oz (83.7 kg)    -----------------------------------------------------------------------------------------  Patient Active Problem List   Diagnosis Date Noted  . Gross hematuria 03/10/2020  . Elevated glucose 11/13/2019  . Low TSH level 11/13/2019  . Essential hypertension 08/11/2016  . Encounter for screening colonoscopy 05/24/2016  . Pure hypercholesterolemia 03/19/2015  .  Obstructive sleep apnea 03/19/2015  . Snoring 03/19/2015  . Tendinitis of right shoulder 03/19/2015  . Incomplete bladder emptying 06/10/2012  . Benign prostatic hyperplasia with urinary obstruction 06/10/2012  . Nodular prostate with urinary obstruction 06/10/2012   Social History   Tobacco Use  . Smoking status: Former Smoker    Years: 4.00  . Smokeless tobacco: Never Used  . Tobacco comment: QUIT IN 1970'S  Substance Use Topics  . Alcohol use: Yes    Alcohol/week: 0.0 standard drinks    Comment: OCCASIONALLY  . Drug use: No   No Known Allergies     Medications: Outpatient Medications Prior to Visit  Medication Sig  . Omega 3 1200 MG CAPS Take 2 capsules by mouth daily.  . [DISCONTINUED] lisinopril (ZESTRIL) 5 MG tablet TAKE 1 TABLET BY MOUTH EVERY DAY  . [DISCONTINUED] pravastatin (PRAVACHOL) 20 MG tablet TAKE 1 TABLET BY MOUTH EVERY DAY  . [DISCONTINUED] tamsulosin (FLOMAX) 0.4 MG CAPS capsule TAKE 1 CAPSULE BY MOUTH EVERY DAY   No facility-administered medications prior to visit.    Review of Systems  Constitutional: Negative.   Respiratory: Negative.   Cardiovascular: Negative.   Gastrointestinal: Negative.   Musculoskeletal: Negative.   Skin: Negative for wound.  Neurological: Negative for dizziness, light-headedness and headaches.    Last thyroid functions Lab Results  Component Value Date   TSH 0.276 (L) 04/19/2020   T4TOTAL 7.1 07/20/2017        Objective    BP 104/71 (BP Location: Left Arm, Patient Position: Sitting, Cuff Size: Normal)   Pulse 73   Temp 98.8 F (37.1 C) (Oral)   Wt 171 lb (77.6 kg)   BMI 24.54 kg/m  BP Readings from Last 3 Encounters:  10/19/20 104/71  07/22/20 102/72  04/19/20 118/75   Wt Readings from Last 3 Encounters:  10/19/20 171 lb (77.6 kg)  07/22/20 182 lb (82.6 kg)  04/19/20 184 lb 9.6 oz (83.7 kg)      Physical Exam Vitals reviewed.  Constitutional:      Appearance: Normal appearance.  HENT:     Head:  Normocephalic and atraumatic.  Eyes:     Conjunctiva/sclera: Conjunctivae normal.     Pupils: Pupils are equal, round, and reactive to light.  Cardiovascular:     Rate and Rhythm: Normal rate and regular rhythm.     Pulses: Normal pulses.     Heart sounds: Normal heart sounds. No murmur heard. No friction rub. No gallop.   Pulmonary:     Effort: Pulmonary effort is normal. No respiratory distress.     Breath sounds: Normal breath sounds. No stridor. No wheezing or rales.  Abdominal:     General: Bowel sounds are normal.     Palpations: Abdomen is soft.     Tenderness: There is no abdominal tenderness.  Musculoskeletal:     Right lower leg: No edema.     Left lower leg: No edema.  Skin:    General: Skin is warm and dry.  Neurological:  General: No focal deficit present.     Mental Status: He is alert and oriented to person, place, and time.  Psychiatric:        Mood and Affect: Mood normal.        Behavior: Behavior normal.       Results for orders placed or performed in visit on 10/19/20  POCT glycosylated hemoglobin (Hb A1C)  Result Value Ref Range   Hemoglobin A1C 6.5 (A) 4.0 - 5.6 %    Assessment & Plan     Problem List Items Addressed This Visit      Cardiovascular and Mediastinum   Essential hypertension - Primary   Relevant Medications   lisinopril (ZESTRIL) 5 MG tablet   pravastatin (PRAVACHOL) 20 MG tablet   Other Relevant Orders   Comprehensive metabolic panel     Genitourinary   Benign prostatic hyperplasia with urinary obstruction   Relevant Medications   tamsulosin (FLOMAX) 0.4 MG CAPS capsule     Other   Pure hypercholesterolemia   Relevant Medications   lisinopril (ZESTRIL) 5 MG tablet   pravastatin (PRAVACHOL) 20 MG tablet   Other Relevant Orders   Lipid Panel   Elevated glucose   Relevant Orders   POCT glycosylated hemoglobin (Hb A1C) (Completed)   Low TSH level   Relevant Orders   TSH     Plan  Declines starting metformin at  this time, will recheck a1c in 3 months  Will follow up with lab results  BP at goal< 130/80  Return in about 3 months (around 01/18/2021).       [place], [place] [phone] (phone) [phone] (fax)  [place]

## 2020-10-18 NOTE — Patient Instructions (Signed)
Diabetes Care, 44(Suppl 1), D40-C14. https://doi.org/https://doi.org/10.2337/dc21-S003">  Prediabetes Prediabetes is when your blood sugar (blood glucose) level is higher than normal but not high enough for you to be diagnosed with type 2 diabetes. Having prediabetes puts you at risk for developing type 2 diabetes (type 2 diabetes mellitus). With certain lifestyle changes, you may be able to prevent or delay the onset of type 2 diabetes. This is important because type 2 diabetes can lead to serious complications, such as:  Heart disease.  Stroke.  Blindness.  Kidney disease.  Depression.  Poor circulation in the feet and legs. In severe cases, this could lead to surgical removal of a leg (amputation). What are the causes? The exact cause of prediabetes is not known. It may result from insulin resistance. Insulin resistance develops when cells in the body do not respond properly to insulin that the body makes. This can cause excess glucose to build up in the blood. High blood glucose (hyperglycemia) can develop. What increases the risk? The following factors may make you more likely to develop this condition:  You have a family member with type 2 diabetes.  You are older than 45 years.  You had a temporary form of diabetes during a pregnancy (gestational diabetes).  You had polycystic ovary syndrome (PCOS).  You are overweight or obese.  You are inactive (sedentary).  You have a history of heart disease, including problems with cholesterol levels, high levels of blood fats, or high blood pressure. What are the signs or symptoms? You may have no symptoms. If you do have symptoms, they may include:  Increased hunger.  Increased thirst.  Increased urination.  Vision changes, such as blurry vision.  Tiredness (fatigue). How is this diagnosed? This condition can be diagnosed with blood tests. Your blood glucose may be checked with one or more of the following tests:  A  fasting blood glucose (FBG) test. You will not be allowed to eat (you will fast) for at least 8 hours before a blood sample is taken.  An A1C blood test (hemoglobin A1C). This test provides information about blood glucose levels over the previous 2?3 months.  An oral glucose tolerance test (OGTT). This test measures your blood glucose at two points in time: ? After fasting. This is your baseline level. ? Two hours after you drink a beverage that contains glucose. You may be diagnosed with prediabetes if:  Your FBG is 100?125 mg/dL (5.6-6.9 mmol/L).  Your A1C level is 5.7?6.4% (39-46 mmol/mol).  Your OGTT result is 140?199 mg/dL (7.8-11 mmol/L). These blood tests may be repeated to confirm your diagnosis.   How is this treated? Treatment may include dietary and lifestyle changes to help lower your blood glucose and prevent type 2 diabetes from developing. In some cases, medicine may be prescribed to help lower the risk of type 2 diabetes. Follow these instructions at home: Nutrition  Follow a healthy meal plan. This includes eating lean proteins, whole grains, legumes, fresh fruits and vegetables, low-fat dairy products, and healthy fats.  Follow instructions from your health care provider about eating or drinking restrictions.  Meet with a dietitian to create a healthy eating plan that is right for you.   Lifestyle  Do moderate-intensity exercise for at least 30 minutes a day on 5 or more days each week, or as told by your health care provider. A mix of activities may be best, such as: ? Brisk walking, swimming, biking, and weight lifting.  Lose weight as told by your health  care provider. Losing 5-7% of your body weight can reverse insulin resistance.  Do not drink alcohol if: ? Your health care provider tells you not to drink. ? You are pregnant, may be pregnant, or are planning to become pregnant.  If you drink alcohol: ? Limit how much you use to:  0-1 drink a day for  women.  0-2 drinks a day for men. ? Be aware of how much alcohol is in your drink. In the [place] one drink equals one 12 oz bottle of beer (355 mL), one 5 oz glass of wine (148 mL), or one 1 oz glass of hard liquor (44 mL). General instructions  Take over-the-counter and prescription medicines only as told by your health care provider. You may be prescribed medicines that help lower the risk of type 2 diabetes.  Do not use any products that contain nicotine or tobacco, such as cigarettes, e-cigarettes, and chewing tobacco. If you need help quitting, ask your health care provider.  Keep all follow-up visits. This is important. Where to find more information  American Diabetes Association: www.diabetes.org  Academy of Nutrition and Dietetics: www.eatright.org  American Heart Association: www.heart.org Contact a health care provider if:  You have any of these symptoms: ? Increased hunger. ? Increased urination. ? Increased thirst. ? Fatigue. ? Vision changes, such as blurry vision. Get help right away if you:  Have shortness of breath.  Feel confused.  Vomit or feel like you may vomit. Summary  Prediabetes is when your blood sugar (blood glucose)level is higher than normal but not high enough for you to be diagnosed with type 2 diabetes.  Having prediabetes puts you at risk for developing type 2 diabetes (type 2 diabetes mellitus).  Make lifestyle changes such as eating a healthy diet and exercising regularly to help prevent diabetes. Lose weight as told by your health care provider. This information is not intended to replace advice given to you by your health care provider. Make sure you discuss any questions you have with your health care provider. Document Revised: 09/11/2019 Document Reviewed: 09/11/2019 Elsevier Patient Education  2021 [place]. Hypertension, Adult High blood pressure (hypertension) is when the force of blood pumping through the arteries is too  strong. The arteries are the blood vessels that carry blood from the heart throughout the body. Hypertension forces the heart to work harder to pump blood and may cause arteries to become narrow or stiff. Untreated or uncontrolled hypertension can cause a heart attack, heart failure, a stroke, kidney disease, and other problems. A blood pressure reading consists of a higher number over a lower number. Ideally, your blood pressure should be below 120/80. The first ("top") number is called the systolic pressure. It is a measure of the pressure in your arteries as your heart beats. The second ("bottom") number is called the diastolic pressure. It is a measure of the pressure in your arteries as the heart relaxes. What are the causes? The exact cause of this condition is not known. There are some conditions that result in or are related to high blood pressure. What increases the risk? Some risk factors for high blood pressure are under your control. The following factors may make you more likely to develop this condition:  Smoking.  Having type 2 diabetes mellitus, high cholesterol, or both.  Not getting enough exercise or physical activity.  Being overweight.  Having too much fat, sugar, calories, or salt (sodium) in your diet.  Drinking too much alcohol. Some risk factors  for high blood pressure may be difficult or impossible to change. Some of these factors include:  Having chronic kidney disease.  Having a family history of high blood pressure.  Age. Risk increases with age.  Race. You may be at higher risk if you are African American.  Gender. Men are at higher risk than women before age 42. After age 52, women are at higher risk than men.  Having obstructive sleep apnea.  Stress. What are the signs or symptoms? High blood pressure may not cause symptoms. Very high blood pressure (hypertensive crisis) may cause:  Headache.  Anxiety.  Shortness of  breath.  Nosebleed.  Nausea and vomiting.  Vision changes.  Severe chest pain.  Seizures. How is this diagnosed? This condition is diagnosed by measuring your blood pressure while you are seated, with your arm resting on a flat surface, your legs uncrossed, and your feet flat on the floor. The cuff of the blood pressure monitor will be placed directly against the skin of your upper arm at the level of your heart. It should be measured at least twice using the same arm. Certain conditions can cause a difference in blood pressure between your right and left arms. Certain factors can cause blood pressure readings to be lower or higher than normal for a short period of time:  When your blood pressure is higher when you are in a health care provider's office than when you are at home, this is called white coat hypertension. Most people with this condition do not need medicines.  When your blood pressure is higher at home than when you are in a health care provider's office, this is called masked hypertension. Most people with this condition may need medicines to control blood pressure. If you have a high blood pressure reading during one visit or you have normal blood pressure with other risk factors, you may be asked to:  Return on a different day to have your blood pressure checked again.  Monitor your blood pressure at home for 1 week or longer. If you are diagnosed with hypertension, you may have other blood or imaging tests to help your health care provider understand your overall risk for other conditions. How is this treated? This condition is treated by making healthy lifestyle changes, such as eating healthy foods, exercising more, and reducing your alcohol intake. Your health care provider may prescribe medicine if lifestyle changes are not enough to get your blood pressure under control, and if:  Your systolic blood pressure is above 130.  Your diastolic blood pressure is above  80. Your personal target blood pressure may vary depending on your medical conditions, your age, and other factors. Follow these instructions at home: Eating and drinking  Eat a diet that is high in fiber and potassium, and low in sodium, added sugar, and fat. An example eating plan is called the DASH (Dietary Approaches to Stop Hypertension) diet. To eat this way: ? Eat plenty of fresh fruits and vegetables. Try to fill one half of your plate at each meal with fruits and vegetables. ? Eat whole grains, such as whole-wheat pasta, brown rice, or whole-grain bread. Fill about one fourth of your plate with whole grains. ? Eat or drink low-fat dairy products, such as skim milk or low-fat yogurt. ? Avoid fatty cuts of meat, processed or cured meats, and poultry with skin. Fill about one fourth of your plate with lean proteins, such as fish, chicken without skin, beans, eggs, or tofu. ?  Avoid pre-made and processed foods. These tend to be higher in sodium, added sugar, and fat.  Reduce your daily sodium intake. Most people with hypertension should eat less than 1,500 mg of sodium a day.  Do not drink alcohol if: ? Your health care provider tells you not to drink. ? You are pregnant, may be pregnant, or are planning to become pregnant.  If you drink alcohol: ? Limit how much you use to:  0-1 drink a day for women.  0-2 drinks a day for men. ? Be aware of how much alcohol is in your drink. In the [place] one drink equals one 12 oz bottle of beer (355 mL), one 5 oz glass of wine (148 mL), or one 1 oz glass of hard liquor (44 mL).   Lifestyle  Work with your health care provider to maintain a healthy body weight or to lose weight. Ask what an ideal weight is for you.  Get at least 30 minutes of exercise most days of the week. Activities may include walking, swimming, or biking.  Include exercise to strengthen your muscles (resistance exercise), such as Pilates or lifting weights, as part of your  weekly exercise routine. Try to do these types of exercises for 30 minutes at least 3 days a week.  Do not use any products that contain nicotine or tobacco, such as cigarettes, e-cigarettes, and chewing tobacco. If you need help quitting, ask your health care provider.  Monitor your blood pressure at home as told by your health care provider.  Keep all follow-up visits as told by your health care provider. This is important.   Medicines  Take over-the-counter and prescription medicines only as told by your health care provider. Follow directions carefully. Blood pressure medicines must be taken as prescribed.  Do not skip doses of blood pressure medicine. Doing this puts you at risk for problems and can make the medicine less effective.  Ask your health care provider about side effects or reactions to medicines that you should watch for. Contact a health care provider if you:  Think you are having a reaction to a medicine you are taking.  Have headaches that keep coming back (recurring).  Feel dizzy.  Have swelling in your ankles.  Have trouble with your vision. Get help right away if you:  Develop a severe headache or confusion.  Have unusual weakness or numbness.  Feel faint.  Have severe pain in your chest or abdomen.  Vomit repeatedly.  Have trouble breathing. Summary  Hypertension is when the force of blood pumping through your arteries is too strong. If this condition is not controlled, it may put you at risk for serious complications.  Your personal target blood pressure may vary depending on your medical conditions, your age, and other factors. For most people, a normal blood pressure is less than 120/80.  Hypertension is treated with lifestyle changes, medicines, or a combination of both. Lifestyle changes include losing weight, eating a healthy, low-sodium diet, exercising more, and limiting alcohol. This information is not intended to replace advice given to  you by your health care provider. Make sure you discuss any questions you have with your health care provider. Document Revised: 02/20/2018 Document Reviewed: 02/20/2018 Elsevier Patient Education  2021 [employer].

## 2020-10-19 ENCOUNTER — Ambulatory Visit: Payer: 59 | Admitting: Family Medicine

## 2020-10-19 ENCOUNTER — Other Ambulatory Visit: Payer: Self-pay

## 2020-10-19 ENCOUNTER — Ambulatory Visit: Payer: Self-pay | Admitting: Family Medicine

## 2020-10-19 ENCOUNTER — Encounter: Payer: Self-pay | Admitting: Family Medicine

## 2020-10-19 VITALS — BP 104/71 | HR 73 | Temp 98.8°F | Wt 171.0 lb

## 2020-10-19 DIAGNOSIS — E7800 Pure hypercholesterolemia, unspecified: Secondary | ICD-10-CM

## 2020-10-19 DIAGNOSIS — N401 Enlarged prostate with lower urinary tract symptoms: Secondary | ICD-10-CM

## 2020-10-19 DIAGNOSIS — R7989 Other specified abnormal findings of blood chemistry: Secondary | ICD-10-CM

## 2020-10-19 DIAGNOSIS — R7309 Other abnormal glucose: Secondary | ICD-10-CM

## 2020-10-19 DIAGNOSIS — I10 Essential (primary) hypertension: Secondary | ICD-10-CM

## 2020-10-19 DIAGNOSIS — N138 Other obstructive and reflux uropathy: Secondary | ICD-10-CM

## 2020-10-19 LAB — POCT GLYCOSYLATED HEMOGLOBIN (HGB A1C): Hemoglobin A1C: 6.5 % — AB (ref 4.0–5.6)

## 2020-10-19 MED ORDER — LISINOPRIL 5 MG PO TABS: 5.0000 mg | ORAL_TABLET | Freq: Every day | ORAL | 3 refills | Status: DC

## 2020-10-19 MED ORDER — PRAVASTATIN SODIUM 20 MG PO TABS: 20.0000 mg | ORAL_TABLET | Freq: Every day | ORAL | 1 refills | Status: DC

## 2020-10-19 MED ORDER — TAMSULOSIN HCL 0.4 MG PO CAPS: 0.4000 mg | ORAL_CAPSULE | Freq: Every day | ORAL | 3 refills | Status: DC

## 2020-10-20 ENCOUNTER — Other Ambulatory Visit: Payer: Self-pay | Admitting: Family Medicine

## 2020-10-20 LAB — TSH: TSH: 0.299 u[IU]/mL — ABNORMAL LOW (ref 0.450–4.500)

## 2020-10-20 LAB — COMPREHENSIVE METABOLIC PANEL
ALT: 37 IU/L (ref 0–44)
AST: 21 IU/L (ref 0–40)
Albumin/Globulin Ratio: 2.1 (ref 1.2–2.2)
Albumin: 4.7 g/dL (ref 3.8–4.8)
Alkaline Phosphatase: 77 IU/L (ref 44–121)
BUN/Creatinine Ratio: 19 (ref 10–24)
BUN: 20 mg/dL (ref 8–27)
Bilirubin Total: 1.1 mg/dL (ref 0.0–1.2)
CO2: 24 mmol/L (ref 20–29)
Calcium: 9.5 mg/dL (ref 8.6–10.2)
Chloride: 103 mmol/L (ref 96–106)
Creatinine, Ser: 1.04 mg/dL (ref 0.76–1.27)
Globulin, Total: 2.2 g/dL (ref 1.5–4.5)
Glucose: 120 mg/dL — ABNORMAL HIGH (ref 65–99)
Potassium: 5 mmol/L (ref 3.5–5.2)
Sodium: 140 mmol/L (ref 134–144)
Total Protein: 6.9 g/dL (ref 6.0–8.5)
eGFR: 80 mL/min/{1.73_m2} (ref >59)

## 2020-10-20 LAB — LIPID PANEL
Chol/HDL Ratio: 2.6 ratio (ref 0.0–5.0)
Cholesterol, Total: 172 mg/dL (ref 100–199)
HDL: 66 mg/dL (ref >39)
LDL Chol Calc (NIH): 94 mg/dL (ref 0–99)
Triglycerides: 61 mg/dL (ref 0–149)
VLDL Cholesterol Cal: 12 mg/dL (ref 5–40)

## 2020-11-26 DIAGNOSIS — D2272 Melanocytic nevi of left lower limb, including hip: Secondary | ICD-10-CM | POA: Diagnosis not present

## 2020-11-26 DIAGNOSIS — D225 Melanocytic nevi of trunk: Secondary | ICD-10-CM | POA: Diagnosis not present

## 2020-11-26 DIAGNOSIS — D2262 Melanocytic nevi of left upper limb, including shoulder: Secondary | ICD-10-CM | POA: Diagnosis not present

## 2020-11-26 DIAGNOSIS — D2261 Melanocytic nevi of right upper limb, including shoulder: Secondary | ICD-10-CM | POA: Diagnosis not present

## 2021-01-04 DIAGNOSIS — H5203 Hypermetropia, bilateral: Secondary | ICD-10-CM | POA: Diagnosis not present

## 2021-01-20 ENCOUNTER — Other Ambulatory Visit: Payer: Self-pay

## 2021-01-20 ENCOUNTER — Ambulatory Visit (INDEPENDENT_AMBULATORY_CARE_PROVIDER_SITE_OTHER): Payer: Medicare Other | Admitting: Family Medicine

## 2021-01-20 ENCOUNTER — Encounter: Payer: Self-pay | Admitting: Family Medicine

## 2021-01-20 VITALS — BP 109/73 | HR 64 | Temp 97.5°F | Ht 72.0 in | Wt 174.2 lb

## 2021-01-20 DIAGNOSIS — R7989 Other specified abnormal findings of blood chemistry: Secondary | ICD-10-CM

## 2021-01-20 DIAGNOSIS — R7309 Other abnormal glucose: Secondary | ICD-10-CM | POA: Diagnosis not present

## 2021-01-20 DIAGNOSIS — E7800 Pure hypercholesterolemia, unspecified: Secondary | ICD-10-CM | POA: Diagnosis not present

## 2021-01-20 DIAGNOSIS — I10 Essential (primary) hypertension: Secondary | ICD-10-CM

## 2021-01-20 LAB — POCT GLYCOSYLATED HEMOGLOBIN (HGB A1C): Hemoglobin A1C: 5.7 % — AB (ref 4.0–5.6)

## 2021-01-20 NOTE — Patient Instructions (Signed)
Preventive Care 65 Years and Older, Male Preventive care refers to lifestyle choices and visits with your health care provider that can promote health and wellness. This includes: A yearly physical exam. This is also called an annual wellness visit. Regular dental and eye exams. Immunizations. Screening for certain conditions. Healthy lifestyle choices, such as: Eating a healthy diet. Getting regular exercise. Not using drugs or products that contain nicotine and tobacco. Limiting alcohol use. What can I expect for my preventive care visit? Physical exam Your health care provider will check your: Height and weight. These may be used to calculate your BMI (body mass index). BMI is a measurement that tells if you are at a healthy weight. Heart rate and blood pressure. Body temperature. Skin for abnormal spots. Counseling Your health care provider may ask you questions about your: Past medical problems. Family's medical history. Alcohol, tobacco, and drug use. Emotional well-being. Home life and relationship well-being. Sexual activity. Diet, exercise, and sleep habits. History of falls. Memory and ability to understand (cognition). Work and work environment. Access to firearms. What immunizations do I need?  Vaccines are usually given at various ages, according to a schedule. Your health care provider will recommend vaccines for you based on your age, medicalhistory, and lifestyle or other factors, such as travel or where you work. What tests do I need? Blood tests Lipid and cholesterol levels. These may be checked every 5 years, or more often depending on your overall health. Hepatitis C test. Hepatitis B test. Screening Lung cancer screening. You may have this screening every year starting at age 55 if you have a 30-pack-year history of smoking and currently smoke or have quit within the past 15 years. Colorectal cancer screening. All adults should have this screening  starting at age 50 and continuing until age 75. Your health care provider may recommend screening at age 45 if you are at increased risk. You will have tests every 1-10 years, depending on your results and the type of screening test. Prostate cancer screening. Recommendations will vary depending on your family history and other risks. Genital exam to check for testicular cancer or hernias. Diabetes screening. This is done by checking your blood sugar (glucose) after you have not eaten for a while (fasting). You may have this done every 1-3 years. Abdominal aortic aneurysm (AAA) screening. You may need this if you are a current or former smoker. STD (sexually transmitted disease) testing, if you are at risk. Follow these instructions at home: Eating and drinking  Eat a diet that includes fresh fruits and vegetables, whole grains, lean protein, and low-fat dairy products. Limit your intake of foods with high amounts of sugar, saturated fats, and salt. Take vitamin and mineral supplements as recommended by your health care provider. Do not drink alcohol if your health care provider tells you not to drink. If you drink alcohol: Limit how much you have to 0-2 drinks a day. Be aware of how much alcohol is in your drink. In the [place] one drink equals one 12 oz bottle of beer (355 mL), one 5 oz glass of wine (148 mL), or one 1 oz glass of hard liquor (44 mL).  Lifestyle Take daily care of your teeth and gums. Brush your teeth every morning and night with fluoride toothpaste. Floss one time each day. Stay active. Exercise for at least 30 minutes 5 or more days each week. Do not use any products that contain nicotine or tobacco, such as cigarettes, e-cigarettes, and chewing tobacco.   If you need help quitting, ask your health care provider. Do not use drugs. If you are sexually active, practice safe sex. Use a condom or other form of protection to prevent STIs (sexually transmitted infections). Talk  with your health care provider about taking a low-dose aspirin or statin. Find healthy ways to cope with stress, such as: Meditation, yoga, or listening to music. Journaling. Talking to a trusted person. Spending time with friends and family. Safety Always wear your seat belt while driving or riding in a vehicle. Do not drive: If you have been drinking alcohol. Do not ride with someone who has been drinking. When you are tired or distracted. While texting. Wear a helmet and other protective equipment during sports activities. If you have firearms in your house, make sure you follow all gun safety procedures. What's next? Visit your health care provider once a year for an annual wellness visit. Ask your health care provider how often you should have your eyes and teeth checked. Stay up to date on all vaccines. This information is not intended to replace advice given to you by your health care provider. Make sure you discuss any questions you have with your healthcare provider. Document Revised: 03/11/2019 Document Reviewed: 06/06/2018 Elsevier Patient Education  2022 Elsevier Inc.  

## 2021-01-20 NOTE — Progress Notes (Signed)
Established patient visit   Patient: Wayne Holland   DOB: 07-Jul-1955   65 y.o. Male  MRN: [id number] Visit Date: 01/20/2021  Today's healthcare provider: Vernie Murders, PA-C   No chief complaint on file.  Subjective    HPI  Diabetes Mellitus Type II, Follow-up  Lab Results  Component Value Date   HGBA1C 6.5 (A) 10/19/2020   HGBA1C 6.0 (H) 04/19/2020   HGBA1C 6.0 (H) 11/17/2019   Wt Readings from Last 3 Encounters:  10/19/20 171 lb (77.6 kg)  07/22/20 182 lb (82.6 kg)  04/19/20 184 lb 9.6 oz (83.7 kg)   Last seen for diabetes 3 months ago.  Management since then includes watch diet and try exercising. He reports good compliance with treatment. He is not having side effects.  Symptoms: No fatigue No foot ulcerations  No appetite changes No nausea  No paresthesia of the feet  No polydipsia  No polyuria No visual disturbances   No vomiting     Home blood sugar records:  none  Episodes of hypoglycemia? No    Current insulin regiment: {none Most Recent Eye Exam: yes Current exercise: walking Current diet habits: well balanced  Pertinent Labs: Lab Results  Component Value Date   CHOL 172 10/19/2020   HDL 66 10/19/2020   LDLCALC 94 10/19/2020   TRIG 61 10/19/2020   CHOLHDL 2.6 10/19/2020   Lab Results  Component Value Date   NA 140 10/19/2020   K 5.0 10/19/2020   CREATININE 1.04 10/19/2020   GFRNONAA 78 04/19/2020   GFRAA 90 04/19/2020   GLUCOSE 120 (H) 10/19/2020     ---------------------------------------------------------------------------------------------------   Patient Active Problem List   Diagnosis Date Noted   Gross hematuria 03/10/2020   Elevated glucose 11/13/2019   Low TSH level 11/13/2019   Essential hypertension 08/11/2016   Encounter for screening colonoscopy 05/24/2016   Pure hypercholesterolemia 03/19/2015   Obstructive sleep apnea 03/19/2015   Snoring 03/19/2015   Tendinitis of right shoulder 03/19/2015    Incomplete bladder emptying 06/10/2012   Benign prostatic hyperplasia with urinary obstruction 06/10/2012   Nodular prostate with urinary obstruction 06/10/2012   Past Medical History:  Diagnosis Date   Sleep apnea    No Known Allergies     Medications: Outpatient Medications Prior to Visit  Medication Sig   lisinopril (ZESTRIL) 5 MG tablet Take 1 tablet (5 mg total) by mouth daily.   Omega 3 1200 MG CAPS Take 2 capsules by mouth daily.   pravastatin (PRAVACHOL) 20 MG tablet Take 1 tablet (20 mg total) by mouth daily.   tamsulosin (FLOMAX) 0.4 MG CAPS capsule Take 1 capsule (0.4 mg total) by mouth daily.   No facility-administered medications prior to visit.    Review of Systems  Constitutional: Negative.   HENT: Negative.    Respiratory: Negative.    Cardiovascular: Negative.   Gastrointestinal: Negative.   Genitourinary: Negative.        Objective    BP 109/73   Pulse 64   Temp (!) 97.5 F (36.4 C) (Oral)   Ht 6' (1.829 m)   Wt 174 lb 3.2 oz (79 kg)   SpO2 99%   BMI 23.63 kg/m  BP Readings from Last 3 Encounters:  10/19/20 104/71  07/22/20 102/72  04/19/20 118/75   Wt Readings from Last 3 Encounters:  10/19/20 171 lb (77.6 kg)  07/22/20 182 lb (82.6 kg)  04/19/20 184 lb 9.6 oz (83.7 kg)   Physical Exam Constitutional:  General: He is not in acute distress.    Appearance: He is well-developed.  HENT:     Head: Normocephalic and atraumatic.     Right Ear: Hearing normal.     Left Ear: Hearing normal.     Nose: Nose normal.  Eyes:     General: Lids are normal. No scleral icterus.       Right eye: No discharge.        Left eye: No discharge.     Conjunctiva/sclera: Conjunctivae normal.  Cardiovascular:     Rate and Rhythm: Normal rate and regular rhythm.     Heart sounds: Normal heart sounds.  Pulmonary:     Effort: Pulmonary effort is normal. No respiratory distress.     Breath sounds: Normal breath sounds.  Abdominal:     General: Bowel  sounds are normal.     Palpations: Abdomen is soft.  Musculoskeletal:        General: Normal range of motion.     Cervical back: Neck supple.  Skin:    Findings: No lesion or rash.  Neurological:     Mental Status: He is alert and oriented to person, place, and time.  Psychiatric:        Speech: Speech normal.        Behavior: Behavior normal.        Thought Content: Thought content normal.      No results found for any visits on 01/20/21.  Assessment & Plan     1. Elevated glucose Hgb A1C down to 5.7% today from 6.5% (10-19-20). No polydipsia, vision changes or polyuria. Recheck CMP. Continue diet and regular exercise. - POCT HgB A1C - Comprehensive metabolic panel  2. Essential hypertension Very good BP today on the Lisinopril 5 mg qd. Recheck CMP and TSH. - Comprehensive metabolic panel - TSH  3. Low TSH level TSH low at 0.299 on 10-19-20. No heat or cold intolerance, palpitations, tremor, hair loss or dry skin. Recheck thyroid function. No exophthalmos ormyxedema. - TSH - T4  4. Pure hypercholesterolemia Tolerating Pravastatin without side effects. Very good lipid panel in April 2022. Recheck levels in 3-4 months/   No follow-ups on file.      I, Tadeo Besecker, PA-C, have reviewed all documentation for this visit. The documentation on 01/20/21 for the exam, diagnosis, procedures, and orders are all accurate and complete.    Vernie Murders, PA-C  [employer] [phone] (phone) [phone] (fax)  [place]

## 2021-01-26 LAB — COMPREHENSIVE METABOLIC PANEL
ALT: 34 IU/L (ref 0–44)
AST: 20 IU/L (ref 0–40)
Albumin/Globulin Ratio: 2.4 — ABNORMAL HIGH (ref 1.2–2.2)
Albumin: 4.7 g/dL (ref 3.8–4.8)
Alkaline Phosphatase: 76 IU/L (ref 44–121)
BUN/Creatinine Ratio: 14 (ref 10–24)
BUN: 13 mg/dL (ref 8–27)
Bilirubin Total: 0.7 mg/dL (ref 0.0–1.2)
CO2: 23 mmol/L (ref 20–29)
Calcium: 9.5 mg/dL (ref 8.6–10.2)
Chloride: 101 mmol/L (ref 96–106)
Creatinine, Ser: 0.9 mg/dL (ref 0.76–1.27)
Globulin, Total: 2 g/dL (ref 1.5–4.5)
Glucose: 104 mg/dL — ABNORMAL HIGH (ref 65–99)
Potassium: 4.4 mmol/L (ref 3.5–5.2)
Sodium: 140 mmol/L (ref 134–144)
Total Protein: 6.7 g/dL (ref 6.0–8.5)
eGFR: 95 mL/min/{1.73_m2} (ref >59)

## 2021-01-26 LAB — T4: T4, Total: 8.3 ug/dL (ref 4.5–12.0)

## 2021-01-26 LAB — TSH: TSH: 0.562 u[IU]/mL (ref 0.450–4.500)

## 2021-03-09 ENCOUNTER — Other Ambulatory Visit: Payer: Self-pay

## 2021-03-09 ENCOUNTER — Encounter: Payer: Self-pay | Admitting: Urology

## 2021-03-09 ENCOUNTER — Ambulatory Visit: Payer: Medicare Other | Admitting: Urology

## 2021-03-09 ENCOUNTER — Ambulatory Visit
Admission: RE | Admit: 2021-03-09 | Discharge: 2021-03-09 | Disposition: A | Discharge status: Home / Self Care | Payer: Medicare Other | Attending: Urology | Admitting: Urology

## 2021-03-09 ENCOUNTER — Ambulatory Visit
Admission: RE | Admit: 2021-03-09 | Discharge: 2021-03-09 | Disposition: A | Discharge status: Home / Self Care | Payer: Medicare Other | Source: Ambulatory Visit | Attending: Urology | Admitting: Urology

## 2021-03-09 VITALS — BP 111/75 | HR 81 | Ht 71.0 in | Wt 170.0 lb

## 2021-03-09 DIAGNOSIS — N200 Calculus of kidney: Secondary | ICD-10-CM

## 2021-03-09 DIAGNOSIS — N210 Calculus in bladder: Secondary | ICD-10-CM

## 2021-03-09 DIAGNOSIS — N401 Enlarged prostate with lower urinary tract symptoms: Secondary | ICD-10-CM

## 2021-03-09 LAB — URINALYSIS, COMPLETE
Bilirubin, UA: NEGATIVE
Glucose, UA: NEGATIVE
Ketones, UA: NEGATIVE
Leukocytes,UA: NEGATIVE
Nitrite, UA: NEGATIVE
Protein,UA: NEGATIVE
Specific Gravity, UA: 1.025 (ref 1.005–1.030)
Urobilinogen, Ur: 0.2 mg/dL (ref 0.2–1.0)
pH, UA: 5.5 (ref 5.0–7.5)

## 2021-03-09 LAB — MICROSCOPIC EXAMINATION: Bacteria, UA: NONE SEEN

## 2021-03-09 NOTE — Progress Notes (Signed)
03/09/2021 9:28 AM   Wayne Holland March 17, 1956 [id number]  Referring provider: Margo Common, PA-C [address] [place],  [place] [address]  Chief Complaint  Patient presents with   Follow-up    HPI: 65 y.o. male presents for follow-up visit.  Last seen ~ 1 year ago after an episode of total gross painless hematuria. CTU and cystoscopy was recommended.  CT remarkable for significant prostate enlargement and bladder calculi the largest measuring 8 mm Estimated prostate size by CT was 166 cc His insurance was not in network with [facility] and he elected referral to [facility] for cystoscopy which was performed 06/04/2020 and was remarkable for prominent lateral lobe enlargement with intravesical median lobe; estimated volume 200 g.  Bladder calculi noted but no bladder mucosal lesions Treatment options were discussed and he elected observation and was to follow-up in 12 months He has since turned 65 and now has Medicare and desired to come back locally for follow-up. He does have sensation of incomplete emptying, intermittent urinary stream, urgency, hesitancy and nocturia x3 IPSS 22/35 Denies recurrent gross hematuria     PMH: Past Medical History:  Diagnosis Date   Sleep apnea     Surgical History: Past Surgical History:  Procedure Laterality Date   COLONOSCOPY  2007   COLONOSCOPY WITH PROPOFOL N/A 06/28/2016   Procedure: COLONOSCOPY WITH PROPOFOL;  Surgeon: [name], MD;  Location: [facility] ENDOSCOPY;  Service: Endoscopy;  Laterality: N/A;   NO PAST SURGERIES      Home Medications:  Allergies as of 03/09/2021   No Known Allergies      Medication List        Accurate as of March 09, 2021 11:59 PM. If you have any questions, ask your nurse or doctor.          lisinopril 5 MG tablet Commonly known as: ZESTRIL Take 1 tablet (5 mg total) by mouth daily.   Omega 3 1200 MG Caps Take 2 capsules by mouth daily.   pravastatin 20 MG  tablet Commonly known as: PRAVACHOL Take 1 tablet (20 mg total) by mouth daily.   tamsulosin 0.4 MG Caps capsule Commonly known as: FLOMAX Take 1 capsule (0.4 mg total) by mouth daily.        Allergies: No Known Allergies  Family History: Family History  Problem Relation Age of Onset   Dementia Mother    Ovarian cancer Mother    Stroke Father    Diabetes Father    Colon cancer Neg Hx     Social History:  reports that he has quit smoking. His smoking use included cigarettes. He has never used smokeless tobacco. He reports current alcohol use. He reports that he does not use drugs.   Physical Exam: BP 111/75   Pulse 81   Ht '5\' 11"'$  (1.803 m)   Wt 170 lb (77.1 kg)   BMI 23.71 kg/m   Constitutional:  Alert and oriented, No acute distress. HEENT:  AT, moist mucus membranes.  Trachea midline, no masses. Cardiovascular: No clubbing, cyanosis, or edema. Respiratory: Normal respiratory effort, no increased work of breathing.   Laboratory Data: Lab Results  Component Value Date   CREATININE 0.90 01/20/2021    Urinalysis    Component Value Date/Time   APPEARANCEUR Clear 03/09/2021 1529   GLUCOSEU Negative 03/09/2021 1529   BILIRUBINUR Negative 03/09/2021 1529   PROTEINUR Negative 03/09/2021 1529   UROBILINOGEN 0.2 04/26/2015 1003   NITRITE Negative 03/09/2021 1529   LEUKOCYTESUR Negative 03/09/2021  1529    Lab Results  Component Value Date   LABMICR See below: 03/09/2021   WBCUA 0-5 03/09/2021   LABEPIT 0-10 03/09/2021   BACTERIA None seen 03/09/2021     Assessment & Plan:    1.  BPH with LUTS Severe LUTS Outlet procedures have previously been discussed here and at [facility] He is interested in pursuing treatment locally and would recommend HoLEP with one of my partners He is interested in pursuing surgery in the next few months and will schedule  2.  Bladder calculi KUB is obtained today and there are 2 calcifications measuring slightly more than 10 mm.   There may be a third calcification overlying the sacrum Cystolitholapaxy can be performed at the time of [place], MD  [facility] [address], [place], [place] [address] [phone]

## 2021-03-10 ENCOUNTER — Encounter: Payer: Self-pay | Admitting: Urology

## 2021-03-11 ENCOUNTER — Encounter: Payer: Self-pay | Admitting: Urology

## 2021-03-24 ENCOUNTER — Telehealth: Payer: Self-pay | Admitting: Urology

## 2021-03-24 NOTE — Telephone Encounter (Signed)
Pt. Called today stating someone was supposed to call him and schedule surgery a few weeks ago. I apologized to him that no one called him and I scheduled a preop appointment with Dr. Diamantina Providence to discuss Holep per Dr. Dene Gentry note and patient mychart message.

## 2021-03-30 ENCOUNTER — Telehealth: Payer: Self-pay | Admitting: Urology

## 2021-03-30 ENCOUNTER — Other Ambulatory Visit: Payer: Self-pay

## 2021-03-30 ENCOUNTER — Other Ambulatory Visit: Payer: Self-pay | Admitting: Urology

## 2021-03-30 ENCOUNTER — Ambulatory Visit: Payer: Medicare Other | Admitting: Urology

## 2021-03-30 ENCOUNTER — Encounter: Payer: Self-pay | Admitting: Urology

## 2021-03-30 VITALS — BP 111/75 | HR 75 | Ht 71.0 in | Wt 170.0 lb

## 2021-03-30 DIAGNOSIS — N401 Enlarged prostate with lower urinary tract symptoms: Secondary | ICD-10-CM | POA: Diagnosis not present

## 2021-03-30 DIAGNOSIS — N210 Calculus in bladder: Secondary | ICD-10-CM

## 2021-03-30 DIAGNOSIS — N138 Other obstructive and reflux uropathy: Secondary | ICD-10-CM

## 2021-03-30 DIAGNOSIS — Z01812 Encounter for preprocedural laboratory examination: Secondary | ICD-10-CM | POA: Diagnosis not present

## 2021-03-30 NOTE — Telephone Encounter (Addendum)
Per Dr.Sninsky Patient is to be scheduled for (HOLEP) Holmium Laser Enucleation of Prostate and a Cystolitholapaxy.  Wayne Holland was contacted and possible surgical dates were discussed, 04/08/21 was agreed upon for surgery.   Patient was directed to call [phone] between 1-3pm the day before surgery to find out surgical arrival time.  Instructions were given not to eat or drink from midnight on the night before surgery and have a driver for the day of surgery. On the surgery day patient was instructed to enter through the [facility] entrance of [facility] report the Same Day Surgery desk.   Pre-Admit Testing will be in contact via phone to set up an interview with the anesthesia team to review your history and medications prior to surgery.   Reminder of this information was sent via mychart to the patient.   Patient is to hold anticoag's and ASA per Dr. Isabel Caprice .

## 2021-03-30 NOTE — Progress Notes (Signed)
   03/30/2021 10:50 AM   Wayne Holland 12/27/1955 [id number]  Reason for visit: BPH, discuss HOLEP  HPI: 65 year old male with long history of urinary symptoms of urgency, frequency, nocturia as well as intermittent painless gross hematuria.  He has been worked up by both Dr. Bernardo Heater here as well as at [facility] urology.  He was found to have a 175 g prostate on CT, and cystoscopy showed obstructive appearing prostate, and 2 bladder stones.  Recent KUB showed 2 bladder stones measuring approximately 1.5 cm each.  IPSS score was 22.  He is interested in HOLEP for his significant BPH with bladder stones and urinary symptoms.  We discussed options including cystolitholapaxy with HOLEP or prostatic artery embolization.  Risks and benefits discussed at length.  I think he would benefit from cystolitholapaxy and HOLEP as his best option for long-term management  We discussed the risks and benefits of HoLEP at length.  The procedure requires general anesthesia and takes 1 to 2 hours, and a holmium laser is used to enucleate the prostate and push this tissue into the bladder.  A morcellator is then used to remove this tissue, which is sent for pathology.  The vast majority(>95%) of patients are able to discharge the same day with a catheter in place for 2 to 3 days, and will follow-up in clinic for a voiding trial.  We specifically discussed the risks of bleeding, infection, retrograde ejaculation, temporary urgency and urge incontinence, very low risk of long-term incontinence, urethral stricture/bladder neck contracture, pathologic evaluation of prostate tissue and possible detection of prostate cancer or other malignancy, and possible need for additional procedures.  Schedule cystolitholapaxy and RCVKF(840R)   Billey Co, MD  [facility] [address] [place], [place] [address] [phone]

## 2021-03-30 NOTE — Patient Instructions (Signed)

## 2021-03-30 NOTE — Progress Notes (Signed)
Surgical Physician Order Form  ** Scheduling expectation : Next Available, patient preference  *Length of Case: 3 hours  *Clearance needed: no  *Anticoagulation Instructions: Hold all anticoagulants  *Aspirin Instructions: Hold Aspirin  *Post-op visit Date/Instructions: 1 to 3-day voiding trial, 10 weeks MD PVR  *Diagnosis: Bladder Stone, BPH with obstruction  *Procedure: Cysto Litholapaxy <2.5cm (44619) and HOLEP  -Admit type: OUTpatient  -Anesthesia: General  -VTE Prophylaxis Standing Order SCD's       Other:   -Standing Lab Orders Per Anesthesia    Lab other: UA&Urine Culture, CBC  -Standing Test orders EKG/Chest x-ray per Anesthesia       Test other:   - Medications:     Ancef 2gm IV   Other Instructions:

## 2021-03-31 LAB — URINALYSIS, COMPLETE
Bilirubin, UA: NEGATIVE
Glucose, UA: NEGATIVE
Ketones, UA: NEGATIVE
Leukocytes,UA: NEGATIVE
Nitrite, UA: NEGATIVE
Protein,UA: NEGATIVE
Specific Gravity, UA: 1.025 (ref 1.005–1.030)
Urobilinogen, Ur: 0.2 mg/dL (ref 0.2–1.0)
pH, UA: 5.5 (ref 5.0–7.5)

## 2021-03-31 LAB — MICROSCOPIC EXAMINATION

## 2021-03-31 NOTE — Progress Notes (Signed)
[facility] Posting Form   Surgery Date/Time: Date: 04/08/2021  Surgeon: Dr. [name], MD  Surgery Location: Day Surgery  Inpt ( No  )   Outpt (Yes)   Obs ( No  )   Diagnosis: N21.0 Bladder Stone; N40.1 N13.8  -CPT: 01007   Surgery: HOLEP  -CPT: 12197  Surgery: Cystolitholapaxy  Stop Anticoagulations: Yes, including ASA  Cardiac/Medical/Pulmonary Clearance needed: no  *Orders entered into EPIC  Date: 03/31/21   *Case booked in [place]  Date: 03/31/21  *Notified pt of Surgery: Date: 03/31/21  PRE-OP UA & CX: Yes, also collect CBC  *Placed into Prior Authorization Work [place] Date: 03/31/21   Assistant/laser/rep:No

## 2021-03-31 NOTE — Telephone Encounter (Signed)
Orders for Surgery entered and faxed to Pre-Admit.

## 2021-04-03 LAB — CULTURE, URINE COMPREHENSIVE

## 2021-04-05 ENCOUNTER — Other Ambulatory Visit: Payer: Self-pay

## 2021-04-05 ENCOUNTER — Other Ambulatory Visit
Admission: RE | Admit: 2021-04-05 | Discharge: 2021-04-05 | Disposition: A | Discharge status: Home / Self Care | Payer: Medicare Other | Source: Ambulatory Visit | Attending: Urology | Admitting: Urology

## 2021-04-05 DIAGNOSIS — Z01818 Encounter for other preprocedural examination: Secondary | ICD-10-CM | POA: Insufficient documentation

## 2021-04-05 HISTORY — DX: Prediabetes: R73.03

## 2021-04-05 HISTORY — DX: Personal history of urinary calculi: Z87.442

## 2021-04-05 HISTORY — DX: Essential (primary) hypertension: I10

## 2021-04-05 NOTE — Patient Instructions (Addendum)
Your procedure is scheduled on: 04/08/21 - Friday Report to the Registration Desk on the 1st floor of the [facility]. To find out your arrival time, please call [phone] between 1PM - 3PM on: 04/07/21 - Thursday Report to the [facility] for Labs/EKG on 04/06/21 at 8:30 am.  REMEMBER: Instructions that are not followed completely may result in serious medical risk, up to and including death; or upon the discretion of your surgeon and anesthesiologist your surgery may need to be rescheduled.  Do not eat food or drink any liquids after midnight the night before surgery.  No gum chewing, lozengers or hard candies.  TAKE THESE MEDICATIONS THE MORNING OF SURGERY WITH A SIP OF WATER:  - pravastatin (PRAVACHOL) 20 MG tablet  One week prior to surgery: Stop Anti-inflammatories (NSAIDS) such as Advil, Aleve, Ibuprofen, Motrin, Naproxen, Naprosyn and Aspirin based products such as Excedrin, Goodys Powder, BC Powder.  Stop ANY OVER THE COUNTER supplements until after surgery.  You may take Tylenol as directed if needed for pain up until the day of surgery.  No Alcohol for 24 hours before or after surgery.  No Smoking including e-cigarettes for 24 hours prior to surgery.  No chewable tobacco products for at least 6 hours prior to surgery.  No nicotine patches on the day of surgery.  Do not use any "recreational" drugs for at least a week prior to your surgery.  Please be advised that the combination of cocaine and anesthesia may have negative outcomes, up to and including death. If you test positive for cocaine, your surgery will be cancelled.  On the morning of surgery brush your teeth with toothpaste and water, you may rinse your mouth with mouthwash if you wish. Do not swallow any toothpaste or mouthwash.  Do not wear jewelry, make-up, hairpins, clips or nail polish.  Do not wear lotions, powders, or perfumes.   Do not shave body from the neck down 48 hours prior to  surgery just in case you cut yourself which could leave a site for infection.  Also, freshly shaved skin may become irritated if using the CHG soap.  Contact lenses, hearing aids and dentures may not be worn into surgery.  Do not bring valuables to the hospital. [facility] is not responsible for any missing/lost belongings or valuables.   Notify your doctor if there is any change in your medical condition (cold, fever, infection).  Wear comfortable clothing (specific to your surgery type) to the hospital.  After surgery, you can help prevent lung complications by doing breathing exercises.  Take deep breaths and cough every 1-2 hours. Your doctor may order a device called an Incentive Spirometer to help you take deep breaths. When coughing or sneezing, hold a pillow firmly against your incision with both hands. This is called "splinting." Doing this helps protect your incision. It also decreases belly discomfort.  If you are being admitted to the hospital overnight, leave your suitcase in the car. After surgery it may be brought to your room.  If you are being discharged the day of surgery, you will not be allowed to drive home. You will need a responsible adult (18 years or older) to drive you home and stay with you that night.   If you are taking public transportation, you will need to have a responsible adult (18 years or older) with you. Please confirm with your physician that it is acceptable to use public transportation.   Please call the [facility].  at [phone] if you have any questions about these instructions.  Surgery Visitation Policy:  Patients undergoing a surgery or procedure may have one family member or support person with them as long as that person is not COVID-19 positive or experiencing its symptoms.  That person may remain in the waiting area during the procedure and may rotate out with other people.  Inpatient Visitation:    Visiting  hours are 7 a.m. to 8 p.m. Up to two visitors ages 16+ are allowed at one time in a patient room. The visitors may rotate out with other people during the day. Visitors must check out when they leave, or other visitors will not be allowed. One designated support person may remain overnight. The visitor must pass COVID-19 screenings, use hand sanitizer when entering and exiting the patient's room and wear a mask at all times, including in the patient's room. Patients must also wear a mask when staff or their visitor are in the room. Masking is required regardless of vaccination status.

## 2021-04-06 ENCOUNTER — Other Ambulatory Visit
Admission: RE | Admit: 2021-04-06 | Discharge: 2021-04-06 | Disposition: A | Discharge status: Home / Self Care | Payer: Medicare Other | Source: Ambulatory Visit | Attending: Urology | Admitting: Urology

## 2021-04-06 ENCOUNTER — Encounter: Payer: Self-pay | Admitting: Urgent Care

## 2021-04-06 DIAGNOSIS — N138 Other obstructive and reflux uropathy: Secondary | ICD-10-CM

## 2021-04-06 DIAGNOSIS — Z01818 Encounter for other preprocedural examination: Secondary | ICD-10-CM | POA: Diagnosis not present

## 2021-04-06 DIAGNOSIS — N210 Calculus in bladder: Secondary | ICD-10-CM

## 2021-04-06 LAB — BASIC METABOLIC PANEL
Anion gap: 10 (ref 5–15)
BUN: 16 mg/dL (ref 8–23)
CO2: 26 mmol/L (ref 22–32)
Calcium: 9.2 mg/dL (ref 8.9–10.3)
Chloride: 102 mmol/L (ref 98–111)
Creatinine, Ser: 0.92 mg/dL (ref 0.61–1.24)
GFR, Estimated: >60 mL/min (ref >60)
Glucose, Bld: 110 mg/dL — ABNORMAL HIGH (ref 70–99)
Potassium: 4 mmol/L (ref 3.5–5.1)
Sodium: 138 mmol/L (ref 135–145)

## 2021-04-06 LAB — CBC
HCT: 44.4 % (ref 39.0–52.0)
Hemoglobin: 15.8 g/dL (ref 13.0–17.0)
MCH: 31.9 pg (ref 26.0–34.0)
MCHC: 35.6 g/dL (ref 30.0–36.0)
MCV: 89.7 fL (ref 80.0–100.0)
Platelets: 213 10*3/uL (ref 150–400)
RBC: 4.95 MIL/uL (ref 4.22–5.81)
RDW: 12.2 % (ref 11.5–15.5)
WBC: 4.9 10*3/uL (ref 4.0–10.5)
nRBC: 0 % (ref 0.0–0.2)

## 2021-04-08 ENCOUNTER — Other Ambulatory Visit: Payer: Self-pay

## 2021-04-08 ENCOUNTER — Ambulatory Visit: Payer: Medicare Other | Admitting: Certified Registered Nurse Anesthetist

## 2021-04-08 ENCOUNTER — Ambulatory Visit
Admission: RE | Admit: 2021-04-08 | Discharge: 2021-04-08 | Disposition: A | Discharge status: Home / Self Care | Payer: Medicare Other | Attending: Urology | Admitting: Urology

## 2021-04-08 ENCOUNTER — Ambulatory Visit: Payer: Medicare Other

## 2021-04-08 ENCOUNTER — Encounter
Admission: RE | Disposition: A | Discharge status: Home / Self Care | Payer: Self-pay | Source: Home / Self Care | Attending: Urology

## 2021-04-08 ENCOUNTER — Encounter: Payer: Self-pay | Admitting: Urology

## 2021-04-08 DIAGNOSIS — N138 Other obstructive and reflux uropathy: Secondary | ICD-10-CM | POA: Diagnosis not present

## 2021-04-08 DIAGNOSIS — Z6823 Body mass index (BMI) 23.0-23.9, adult: Secondary | ICD-10-CM | POA: Insufficient documentation

## 2021-04-08 DIAGNOSIS — N401 Enlarged prostate with lower urinary tract symptoms: Secondary | ICD-10-CM | POA: Insufficient documentation

## 2021-04-08 DIAGNOSIS — R310 Gross hematuria: Secondary | ICD-10-CM | POA: Insufficient documentation

## 2021-04-08 DIAGNOSIS — Z87891 Personal history of nicotine dependence: Secondary | ICD-10-CM | POA: Diagnosis not present

## 2021-04-08 DIAGNOSIS — Z87442 Personal history of urinary calculi: Secondary | ICD-10-CM | POA: Insufficient documentation

## 2021-04-08 DIAGNOSIS — N400 Enlarged prostate without lower urinary tract symptoms: Secondary | ICD-10-CM | POA: Diagnosis not present

## 2021-04-08 DIAGNOSIS — N210 Calculus in bladder: Secondary | ICD-10-CM | POA: Diagnosis not present

## 2021-04-08 DIAGNOSIS — R7303 Prediabetes: Secondary | ICD-10-CM | POA: Diagnosis not present

## 2021-04-08 DIAGNOSIS — E669 Obesity, unspecified: Secondary | ICD-10-CM | POA: Diagnosis not present

## 2021-04-08 DIAGNOSIS — G4730 Sleep apnea, unspecified: Secondary | ICD-10-CM | POA: Insufficient documentation

## 2021-04-08 DIAGNOSIS — I10 Essential (primary) hypertension: Secondary | ICD-10-CM | POA: Insufficient documentation

## 2021-04-08 HISTORY — PX: CYSTOSCOPY WITH LITHOLAPAXY: SHX1425

## 2021-04-08 HISTORY — PX: HOLEP-LASER ENUCLEATION OF THE PROSTATE WITH MORCELLATION: SHX6641

## 2021-04-08 SURGERY — ENUCLEATION, PROSTATE, USING LASER, WITH MORCELLATION
Anesthesia: General

## 2021-04-08 MED ORDER — LACTATED RINGERS IV SOLN: INTRAVENOUS | Status: DC

## 2021-04-08 MED ORDER — SODIUM CHLORIDE 0.9 % IR SOLN
Status: DC | PRN
  Administered 2021-04-08: 1400 mL

## 2021-04-08 MED ORDER — MIDAZOLAM HCL 2 MG/2ML IJ SOLN
INTRAMUSCULAR | Status: AC
  Filled 2021-04-08: qty 2

## 2021-04-08 MED ORDER — FENTANYL CITRATE (PF) 100 MCG/2ML IJ SOLN: 25.0000 ug | INTRAMUSCULAR | Status: DC | PRN

## 2021-04-08 MED ORDER — FAMOTIDINE 20 MG PO TABS
20.0000 mg | ORAL_TABLET | Freq: Once | ORAL | Status: AC
  Administered 2021-04-08: 20 mg via ORAL

## 2021-04-08 MED ORDER — DEXAMETHASONE SODIUM PHOSPHATE 10 MG/ML IJ SOLN
INTRAMUSCULAR | Status: DC | PRN
  Administered 2021-04-08: 8 mg via INTRAVENOUS

## 2021-04-08 MED ORDER — ACETAMINOPHEN 10 MG/ML IV SOLN
INTRAVENOUS | Status: AC
  Filled 2021-04-08: qty 100

## 2021-04-08 MED ORDER — ONDANSETRON HCL 4 MG/2ML IJ SOLN
INTRAMUSCULAR | Status: DC | PRN
  Administered 2021-04-08: 4 mg via INTRAVENOUS

## 2021-04-08 MED ORDER — LIDOCAINE HCL (CARDIAC) PF 100 MG/5ML IV SOSY
PREFILLED_SYRINGE | INTRAVENOUS | Status: DC | PRN
  Administered 2021-04-08: 100 mg via INTRAVENOUS

## 2021-04-08 MED ORDER — PHENYLEPHRINE HCL (PRESSORS) 10 MG/ML IV SOLN
INTRAVENOUS | Status: DC | PRN
  Administered 2021-04-08 (×3): 100 ug via INTRAVENOUS

## 2021-04-08 MED ORDER — MIDAZOLAM HCL 2 MG/2ML IJ SOLN
INTRAMUSCULAR | Status: DC | PRN
  Administered 2021-04-08: 2 mg via INTRAVENOUS

## 2021-04-08 MED ORDER — PROPOFOL 10 MG/ML IV BOLUS
INTRAVENOUS | Status: AC
  Filled 2021-04-08: qty 20

## 2021-04-08 MED ORDER — SUGAMMADEX SODIUM 200 MG/2ML IV SOLN
INTRAVENOUS | Status: DC | PRN
  Administered 2021-04-08: 308.4 mg via INTRAVENOUS

## 2021-04-08 MED ORDER — ORAL CARE MOUTH RINSE: 15.0000 mL | Freq: Once | OROMUCOSAL | Status: AC

## 2021-04-08 MED ORDER — BELLADONNA ALKALOIDS-OPIUM 16.2-60 MG RE SUPP
RECTAL | Status: AC
  Filled 2021-04-08: qty 1

## 2021-04-08 MED ORDER — FAMOTIDINE 20 MG PO TABS
ORAL_TABLET | ORAL | Status: AC
  Filled 2021-04-08: qty 1

## 2021-04-08 MED ORDER — EPHEDRINE SULFATE 50 MG/ML IJ SOLN
INTRAMUSCULAR | Status: DC | PRN
  Administered 2021-04-08: 10 mg via INTRAVENOUS

## 2021-04-08 MED ORDER — CHLORHEXIDINE GLUCONATE 0.12 % MT SOLN
15.0000 mL | Freq: Once | OROMUCOSAL | Status: AC
  Administered 2021-04-08: 15 mL via OROMUCOSAL

## 2021-04-08 MED ORDER — FENTANYL CITRATE (PF) 100 MCG/2ML IJ SOLN
INTRAMUSCULAR | Status: DC | PRN
  Administered 2021-04-08: 50 ug via INTRAVENOUS

## 2021-04-08 MED ORDER — FENTANYL CITRATE (PF) 100 MCG/2ML IJ SOLN
INTRAMUSCULAR | Status: AC
  Filled 2021-04-08: qty 2

## 2021-04-08 MED ORDER — MEPERIDINE HCL 25 MG/ML IJ SOLN: 6.2500 mg | INTRAMUSCULAR | Status: DC | PRN

## 2021-04-08 MED ORDER — OXYCODONE HCL 5 MG/5ML PO SOLN
5.0000 mg | Freq: Once | ORAL | Status: DC | PRN
Start: 2021-04-08 — End: 2021-04-08

## 2021-04-08 MED ORDER — OXYCODONE HCL 5 MG PO TABS: 5.0000 mg | ORAL_TABLET | Freq: Once | ORAL | Status: DC | PRN

## 2021-04-08 MED ORDER — PHENYLEPHRINE HCL-NACL 20-0.9 MG/250ML-% IV SOLN
INTRAVENOUS | Status: DC | PRN
  Administered 2021-04-08: 40 ug/min via INTRAVENOUS

## 2021-04-08 MED ORDER — HYDROCODONE-ACETAMINOPHEN 5-325 MG PO TABS: 1.0000 | ORAL_TABLET | ORAL | 0 refills | Status: AC | PRN

## 2021-04-08 MED ORDER — ROCURONIUM BROMIDE 100 MG/10ML IV SOLN
INTRAVENOUS | Status: DC | PRN
  Administered 2021-04-08: 50 mg via INTRAVENOUS
  Administered 2021-04-08 (×3): 10 mg via INTRAVENOUS

## 2021-04-08 MED ORDER — ACETAMINOPHEN 10 MG/ML IV SOLN
INTRAVENOUS | Status: DC | PRN
  Administered 2021-04-08: 1000 mg via INTRAVENOUS

## 2021-04-08 MED ORDER — PROPOFOL 10 MG/ML IV BOLUS
INTRAVENOUS | Status: DC | PRN
  Administered 2021-04-08: 160 mg via INTRAVENOUS

## 2021-04-08 MED ORDER — CEFAZOLIN SODIUM-DEXTROSE 2-4 GM/100ML-% IV SOLN
INTRAVENOUS | Status: AC
  Filled 2021-04-08: qty 100

## 2021-04-08 MED ORDER — PROMETHAZINE HCL 25 MG/ML IJ SOLN: 6.2500 mg | INTRAMUSCULAR | Status: DC | PRN

## 2021-04-08 MED ORDER — CHLORHEXIDINE GLUCONATE 0.12 % MT SOLN
OROMUCOSAL | Status: AC
  Filled 2021-04-08: qty 15

## 2021-04-08 MED ORDER — BELLADONNA ALKALOIDS-OPIUM 16.2-60 MG RE SUPP
RECTAL | Status: DC | PRN
  Administered 2021-04-08: 1 via RECTAL

## 2021-04-08 MED ORDER — CEFAZOLIN SODIUM-DEXTROSE 2-4 GM/100ML-% IV SOLN
2.0000 g | INTRAVENOUS | Status: AC
  Administered 2021-04-08: 2 g via INTRAVENOUS

## 2021-04-08 SURGICAL SUPPLY — 46 items
ADAPTER IRRIG TUBE 2 SPIKE SOL (ADAPTER) ×4 IMPLANT
ADPR TBG 2 SPK PMP STRL ASCP (ADAPTER) ×2
BAG DRAIN CYSTO-URO LG1000N (MISCELLANEOUS) ×2 IMPLANT
BAG DRN LRG CPC RND TRDRP CNTR (MISCELLANEOUS) ×1
BAG URO DRAIN 4000ML (MISCELLANEOUS) ×2 IMPLANT
CATH FOLEY 3WAY 30CC 24FR (CATHETERS) ×2
CATH URETL OPEN 5X70 (CATHETERS) IMPLANT
CATH URTH STD 24FR FL 3W 2 (CATHETERS) ×1 IMPLANT
CNTNR SPEC 2.5X3XGRAD LEK (MISCELLANEOUS)
CONT SPEC 4OZ STER OR WHT (MISCELLANEOUS)
CONT SPEC 4OZ STRL OR WHT (MISCELLANEOUS)
CONTAINER COLLECT MORCELLATR (MISCELLANEOUS) ×1 IMPLANT
CONTAINER SPEC 2.5X3XGRAD LEK (MISCELLANEOUS) IMPLANT
DRAPE UTILITY 15X26 TOWEL STRL (DRAPES) ×2 IMPLANT
ELECT BIVAP BIPO 22/24 DONUT (ELECTROSURGICAL)
ELECTRD BIVAP BIPO 22/24 DONUT (ELECTROSURGICAL) IMPLANT
FIBER LASER FLEXIVA PULSE 550 (Laser) IMPLANT
FIBER LASER MOSES 550 DFL (Laser) ×2 IMPLANT
FILTER OVERFLOW MORCELLATOR (FILTER) ×1 IMPLANT
GAUZE 4X4 16PLY ~~LOC~~+RFID DBL (SPONGE) ×2 IMPLANT
GLOVE SURG ENC MOIS LTX SZ6.5 (GLOVE) ×2 IMPLANT
GLOVE SURG UNDER POLY LF SZ7.5 (GLOVE) ×4 IMPLANT
GOWN STRL REUS W/ TWL LRG LVL3 (GOWN DISPOSABLE) ×2 IMPLANT
GOWN STRL REUS W/ TWL XL LVL3 (GOWN DISPOSABLE) ×1 IMPLANT
GOWN STRL REUS W/TWL LRG LVL3 (GOWN DISPOSABLE) ×4
GOWN STRL REUS W/TWL XL LVL3 (GOWN DISPOSABLE) ×2
HOLDER FOLEY CATH W/STRAP (MISCELLANEOUS) ×2 IMPLANT
IV NS IRRIG 3000ML ARTHROMATIC (IV SOLUTION) ×28 IMPLANT
KIT PROBE TRILOGY 3.9X350 (MISCELLANEOUS) IMPLANT
KIT TURNOVER CYSTO (KITS) ×2 IMPLANT
MANIFOLD NEPTUNE II (INSTRUMENTS) IMPLANT
MBRN O SEALING YLW 17 FOR INST (MISCELLANEOUS) ×2
MEMBRANE SLNG YLW 17 FOR INST (MISCELLANEOUS) ×1 IMPLANT
MORCELLATOR COLLECT CONTAINER (MISCELLANEOUS) ×2
MORCELLATOR OVERFLOW FILTER (FILTER) ×2
MORCELLATOR ROTATION 4.75 335 (MISCELLANEOUS) ×2 IMPLANT
PACK CYSTO AR (MISCELLANEOUS) ×2 IMPLANT
SET CYSTO W/LG BORE CLAMP LF (SET/KITS/TRAYS/PACK) ×2 IMPLANT
SET IRRIG Y TYPE TUR BLADDER L (SET/KITS/TRAYS/PACK) ×2 IMPLANT
SLEEVE PROTECTION STRL DISP (MISCELLANEOUS) ×4 IMPLANT
SURGILUBE 2OZ TUBE FLIPTOP (MISCELLANEOUS) ×2 IMPLANT
SYR TOOMEY IRRIG 70ML (MISCELLANEOUS) ×2
SYRINGE TOOMEY IRRIG 70ML (MISCELLANEOUS) ×1 IMPLANT
TUBE PUMP MORCELLATOR PIRANHA (TUBING) ×2 IMPLANT
WATER STERILE IRR 1000ML POUR (IV SOLUTION) ×2 IMPLANT
WATER STERILE IRR 500ML POUR (IV SOLUTION) IMPLANT

## 2021-04-08 NOTE — Anesthesia Preprocedure Evaluation (Signed)
Anesthesia Evaluation  Patient identified by MRN, date of birth, ID band Patient awake    Reviewed: Allergy & Precautions, NPO status , Patient's Chart, lab work & pertinent test results  History of Anesthesia Complications Negative for: history of anesthetic complications  Airway Mallampati: III  TM Distance: >3 FB Neck ROM: Full    Dental no notable dental hx.    Pulmonary sleep apnea (does not routinely use CPAP) , neg COPD, former smoker,    breath sounds clear to auscultation- rhonchi (-) wheezing      Cardiovascular Exercise Tolerance: Good hypertension, Pt. on medications (-) CAD, (-) Past MI, (-) Cardiac Stents and (-) CABG  Rhythm:Regular Rate:Normal - Systolic murmurs and - Diastolic murmurs    Neuro/Psych neg Seizures negative neurological ROS  negative psych ROS   GI/Hepatic negative GI ROS, Neg liver ROS,   Endo/Other  negative endocrine ROSneg diabetes  Renal/GU negative Renal ROS     Musculoskeletal negative musculoskeletal ROS (+)   Abdominal (+) - obese,   Peds  Hematology negative hematology ROS (+)   Anesthesia Other Findings Past Medical History: No date: History of kidney stones No date: Hypertension No date: Pre-diabetes No date: Sleep apnea   Reproductive/Obstetrics                             Anesthesia Physical Anesthesia Plan  ASA: 2  Anesthesia Plan: General   Post-op Pain Management:    Induction: Intravenous  PONV Risk Score and Plan: 1 and Ondansetron and Dexamethasone  Airway Management Planned: Oral ETT  Additional Equipment:   Intra-op Plan:   Post-operative Plan: Extubation in OR  Informed Consent: I have reviewed the patients History and Physical, chart, labs and discussed the procedure including the risks, benefits and alternatives for the proposed anesthesia with the patient or authorized representative who has indicated his/her  understanding and acceptance.     Dental advisory given  Plan Discussed with: CRNA and Anesthesiologist  Anesthesia Plan Comments:         Anesthesia Quick Evaluation

## 2021-04-08 NOTE — OR Nursing (Signed)
Urine light pink in color.  CBI infusion stopped at present.

## 2021-04-08 NOTE — Anesthesia Postprocedure Evaluation (Signed)
Anesthesia Post Note  Patient: Wayne Holland  Procedure(s) Performed: HOLEP-LASER ENUCLEATION OF THE PROSTATE WITH MORCELLATION CYSTOSCOPY WITH LITHOLAPAXY  Patient location during evaluation: PACU Anesthesia Type: General Level of consciousness: awake and alert and oriented Pain management: pain level controlled Vital Signs Assessment: post-procedure vital signs reviewed and stable Respiratory status: spontaneous breathing, nonlabored ventilation and respiratory function stable Cardiovascular status: blood pressure returned to baseline and stable Postop Assessment: no signs of nausea or vomiting Anesthetic complications: no   No notable events documented.   Last Vitals:  Vitals:   04/08/21 1220 04/08/21 1253  BP: 139/87 128/81  Pulse: 68 64  Resp: 16 16  Temp: (!) 36.3 C   SpO2: 99% 99%    Last Pain:  Vitals:   04/08/21 1220  TempSrc: Temporal  PainSc: 0-No pain                 Fin Hupp

## 2021-04-08 NOTE — Anesthesia Procedure Notes (Signed)
Procedure Name: Intubation Date/Time: 04/08/2021 9:30 AM Performed by: Lowry Bowl, CRNA Pre-anesthesia Checklist: Patient identified, Emergency Drugs available, Suction available and Patient being monitored Patient Re-evaluated:Patient Re-evaluated prior to induction Oxygen Delivery Method: Circle system utilized Preoxygenation: Pre-oxygenation with 100% oxygen Induction Type: IV induction and Cricoid Pressure applied Ventilation: Mask ventilation without difficulty Laryngoscope Size: 4 and McGraph Grade View: Grade I Tube type: Oral Tube size: 7.5 mm Number of attempts: 1 Airway Equipment and Method: Stylet and Video-laryngoscopy (elective use of videoscope) Placement Confirmation: ETT inserted through vocal cords under direct vision, positive ETCO2 and breath sounds checked- equal and bilateral Secured at: 21 cm Tube secured with: Tape Dental Injury: Teeth and Oropharynx as per pre-operative assessment

## 2021-04-08 NOTE — H&P (Signed)
   04/08/21 9:04 AM   Wayne Holland February 17, 1956 [id number]  CC: BPH, gross hematuria, bladder stones  HPI: 65 year old male with long history of urinary symptoms of urgency, frequency, nocturia as well as intermittent painless gross hematuria.  He has been worked up by both Dr. Bernardo Holland here as well as at [facility] urology.  He was found to have a 175 g prostate on CT, and cystoscopy showed obstructive appearing prostate, and 2 bladder stones.  Recent KUB showed 2 bladder stones measuring approximately 1.5 cm each.  IPSS score was 22.  He is interested in HOLEP for his significant BPH with bladder stones and urinary symptoms.     PMH: Past Medical History:  Diagnosis Date   History of kidney stones    Hypertension    Pre-diabetes    Sleep apnea     Surgical History: Past Surgical History:  Procedure Laterality Date   COLONOSCOPY  2007   COLONOSCOPY WITH PROPOFOL N/A 06/28/2016   Procedure: COLONOSCOPY WITH PROPOFOL;  Surgeon: Wayne Bellow, MD;  Location: [facility] ENDOSCOPY;  Service: Endoscopy;  Laterality: N/A;   NO PAST SURGERIES       Family History: Family History  Problem Relation Age of Onset   Dementia Mother    Ovarian cancer Mother    Stroke Father    Diabetes Father    Colon cancer Neg Hx     Social History:  reports that he has quit smoking. His smoking use included cigarettes. He has never used smokeless tobacco. He reports current alcohol use. He reports that he does not use drugs.  Physical Exam: BP (!) 154/86   Pulse 66   Temp (!) 97 F (36.1 C) (Temporal)   Resp 16   Wt 77.1 kg   SpO2 100%   BMI 23.71 kg/m    Constitutional:  Alert and oriented, No acute distress. Cardiovascular: Regular rate and rhythm Respiratory: Clear to auscultation bilaterally GI: Abdomen is soft, nontender, nondistended, no abdominal masses   Laboratory Data: Urine culture 10/5 no growth  Assessment & Plan:   65 year old male with BPH and urinary symptoms as well as  intermittent gross hematuria and multiple bladder stones who has opted for HOLEP.  Prostate measures 175 g.  We discussed the risks and benefits of HoLEP at length.  The procedure requires general anesthesia and takes 1 to 2 hours, and a holmium laser is used to enucleate the prostate and push this tissue into the bladder.  A morcellator is then used to remove this tissue, which is sent for pathology.  The vast majority(>95%) of patients are able to discharge the same day with a catheter in place for 2 to 3 days, and will follow-up in clinic for a voiding trial.  We specifically discussed the risks of bleeding, infection, retrograde ejaculation, temporary urgency and urge incontinence, very low risk of long-term incontinence, urethral stricture/bladder neck contracture, pathologic evaluation of prostate tissue and possible detection of prostate cancer or other malignancy, and possible need for additional procedures.  HOLEP and cystolitholapaxy   Wayne Madrid, MD 04/08/2021  [facility] [address] [place], [place] [address] [phone]

## 2021-04-08 NOTE — Op Note (Addendum)
Date of procedure: 04/08/21  Preoperative diagnosis:  BPH Bladder stones  Postoperative diagnosis:  Same  Procedure: HoLEP (Holmium Laser Enucleation of the Prostate) Cystolitholapaxy(<2cm)  Surgeon: [name], MD  Anesthesia: General  Complications: None  Intraoperative findings:  Very large prostate with large intravesical protrusion, moderate bladder trabeculations and diverticula, no suspicious lesions Two bladder stones measuring 1.5 cm each fragmented and all pieces removed Uncomplicated HOLEP, excellent hemostasis, ureteral orifices and verumontanum intact at conclusion of case  EBL: Minimal  Specimens: Prostate chips   Enucleation time: 48 minutes  Morcellation time: 23 minutes  Intra-op weight: 65g  Drains: 24 French three-way, 60 cc in balloon  Indication: Wayne Holland is a 65 y.o. patient with BPH and recurrent gross hematuria with large 175 g prostate.  After reviewing the management options for treatment, they elected to proceed with the above surgical procedure(s). We have discussed the potential benefits and risks of the procedure, side effects of the proposed treatment, the likelihood of the patient achieving the goals of the procedure, and any potential problems that might occur during the procedure or recuperation.  We specifically discussed the risks of bleeding, infection, hematuria and clot retention, need for additional procedures, possible overnight hospital stay, temporary urgency and incontinence, rare long-term incontinence, and retrograde ejaculation.  Informed consent has been obtained.   Description of procedure:  The patient was taken to the operating room and general anesthesia was induced.  The patient was placed in the dorsal lithotomy position, prepped and draped in the usual sterile fashion, and preoperative antibiotics(Ancef) were administered.  SCDs were placed for DVT prophylaxis.  A preoperative time-out was performed.   Wayne Holland sounds were used to gently dilated the urethra up to 23F. The 27 French continuous flow resectoscope was inserted into the urethra using the visual obturator  The prostate was very large with significant intravesical protrusion of a median lobe in the right lateral lobe. The bladder was thoroughly inspected and notable for moderate trabeculations and diverticula, but no suspicious lesions.  There were two yellow stones measuring ~1.5cm each. The ureteral orifices were located in orthotopic position and very close to the bladder neck.    The 500um laser was set to 1.5J and 30Hz  and used to fragment both stones to dust. All fragments were irrigated free from the bladder.  The laser was then set to 2 J and 60 Hz and was used to make a lambda incision just proximal to the verumontanum down to the level of the capsule.  A 4 and 7 o'clock incisions were then made down to the level of the capsule from the bladder neck to the verumontanum to encompass the off-center large median lobe.  The median lobe was then enucleated into the bladder.  The lateral lobes were then incised circumferentially until they were disconnected from the surrounding tissue.  The patient's right lateral lobe was quite large and had significant intravesical protrusion.  The capsule was examined and laser was used for meticulous hemostasis.    The 43 French resectoscope was then switched out for the 29 French nephroscope and the lobes were morcellated and the tissue sent to pathology.  A 24 French three-way catheter was inserted easily with the aid of a catheter guide, and 60 cc were placed in the balloon.  Urine was faint pink.  The catheter irrigated easily with a Toomey syringe.  CBI was initiated. A belladonna suppository was placed.  The patient tolerated the procedure well without any immediate complications  and was extubated and transferred to the recovery room in stable condition.  Urine was clear on fast CBI.  Disposition:  Stable to PACU  Plan: Wean CBI in PACU, anticipate discharge home today with Foley removal in clinic in 2-3 days, and follow-up with MD in 12 weeks with PVR   [name], MD 04/08/2021

## 2021-04-08 NOTE — Discharge Instructions (Addendum)
AMBULATORY SURGERY  ?DISCHARGE INSTRUCTIONS ? ? ?The drugs that you were given will stay in your system until tomorrow so for the next 24 hours you should not: ? ?Drive an automobile ?Make any legal decisions ?Drink any alcoholic beverage ? ? ?You may resume regular meals tomorrow.  Today it is better to start with liquids and gradually work up to solid foods. ? ?You may eat anything you prefer, but it is better to start with liquids, then soup and crackers, and gradually work up to solid foods. ? ? ?Please notify your doctor immediately if you have any unusual bleeding, trouble breathing, redness and pain at the surgery site, drainage, fever, or pain not relieved by medication. ? ? ? ?Additional Instructions: ? ? ? ?Please contact your physician with any problems or Same Day Surgery at [phone], Monday through Friday 6 am to 4 pm, or [place] [facility] Main number at [phone].  ?

## 2021-04-08 NOTE — Transfer of Care (Signed)
Immediate Anesthesia Transfer of Care Note  Patient: Wayne Holland  Procedure(s) Performed: Procedure(s): HOLEP-LASER ENUCLEATION OF THE PROSTATE WITH MORCELLATION (N/A) CYSTOSCOPY WITH LITHOLAPAXY (N/A)  Patient Location: PACU  Anesthesia Type:General  Level of Consciousness: sedated  Airway & Oxygen Therapy: Patient Spontanous Breathing and Patient connected to face mask oxygen  Post-op Assessment: Report given to RN and Post -op Vital signs reviewed and stable  Post vital signs: Reviewed and stable  Last Vitals:  Vitals:   04/08/21 0716 04/08/21 1125  BP: (!) 154/86 131/81  Pulse: 66 (!) 130  Resp: 16 (!) 9  Temp: (!) 36.1 C (!) 36 C  SpO2: 720% (!) 94%    Complications: No apparent anesthesia complications

## 2021-04-11 ENCOUNTER — Other Ambulatory Visit: Payer: Self-pay

## 2021-04-11 ENCOUNTER — Ambulatory Visit: Payer: Medicare Other | Admitting: Physician Assistant

## 2021-04-11 DIAGNOSIS — N401 Enlarged prostate with lower urinary tract symptoms: Secondary | ICD-10-CM

## 2021-04-11 DIAGNOSIS — N138 Other obstructive and reflux uropathy: Secondary | ICD-10-CM

## 2021-04-11 LAB — SURGICAL PATHOLOGY

## 2021-04-11 NOTE — Patient Instructions (Signed)
Congratulations on your recent HOLEP procedure! As discussed in clinic today, there are three main side effects that commonly occur after surgery: Burning or pain with urination: This typically resolves within 1 week of surgery. If you are still having significant pain with urination 10 days after surgery, please call our clinic. We may need to check you for a urinary tract infection at that point, though this is rare. Blood in the urine: This may come and go, but typically resolves completely within 3 weeks of surgery. If you are on blood thinners, it may take longer for the bleeding to resolve. As long as your urine remains thin and runny and you are not passing large clots (around the size of your palm), this is a normal postoperative finding. If you start to pass dark red urine or thick, ketchup-like urine, please call our office immediately. Urinary leakage or urgency: This tends to improve with time, with most patients becoming dry within around 3 months of surgery. You may wear absorbant underwear or liners for security during this time. To help you get dry faster, please make sure you are completing your Kegel exercises as instructed, with a set of 10 exercises completed up to three times daily.  

## 2021-04-11 NOTE — Progress Notes (Signed)
Catheter Removal  Patient is present today for a catheter removal.  60ml of water was drained from the balloon. A 24FR three-way foley cath was removed from the bladder no complications were noted . Patient tolerated well.  Performed by: Debroah Loop, PA-C   Additional notes: Counseled the patient on normal postop findings including dysuria, gross hematuria, and urinary urgency/leakage.  Encouraged him to start Kegel exercises 3x10 sets daily to increase urinary control and wear absorbent products in the meantime for security.  Surgical pathology pending, will defer to Dr. Diamantina Providence to share results when available.  Follow up: 10 week postop visit with PVR with Dr. Diamantina Providence

## 2021-06-08 ENCOUNTER — Encounter: Payer: Self-pay | Admitting: Urology

## 2021-06-08 ENCOUNTER — Ambulatory Visit: Payer: Medicare Other | Admitting: Urology

## 2021-06-08 ENCOUNTER — Other Ambulatory Visit: Payer: Self-pay

## 2021-06-08 VITALS — BP 123/77 | HR 71 | Ht 71.0 in | Wt 180.0 lb

## 2021-06-08 DIAGNOSIS — N138 Other obstructive and reflux uropathy: Secondary | ICD-10-CM

## 2021-06-08 DIAGNOSIS — N401 Enlarged prostate with lower urinary tract symptoms: Secondary | ICD-10-CM

## 2021-06-08 LAB — BLADDER SCAN AMB NON-IMAGING

## 2021-06-08 NOTE — Patient Instructions (Signed)
Kegel Exercises Kegel exercises can help strengthen your pelvic floor muscles. The pelvic floor is a group of muscles that support your rectum, small intestine, and bladder. In females, pelvic floor muscles also help support the uterus. These muscles help you control the flow of urine and stool (feces). Kegel exercises are painless and simple. They do not require any equipment. Your provider may suggest Kegel exercises to: Improve bladder and bowel control. Improve sexual response. Improve weak pelvic floor muscles after surgery to remove the uterus (hysterectomy) or after pregnancy, in females. Improve weak pelvic floor muscles after prostate gland removal or surgery, in males. Kegel exercises involve squeezing your pelvic floor muscles. These are the same muscles you squeeze when you try to stop the flow of urine or keep from passing gas. The exercises can be done while sitting, standing, or lying down, but it is best to vary your position. Ask your health care provider which exercises are safe for you. Do exercises exactly as told by your health care provider and adjust them as directed. Do not begin these exercises until told by your health care provider. Exercises How to do Kegel exercises: Squeeze your pelvic floor muscles tight. You should feel a tight lift in your rectal area. If you are a male, you should also feel a tightness in your vaginal area. Keep your stomach, buttocks, and legs relaxed. Hold the muscles tight for up to 10 seconds. Breathe normally. Relax your muscles for up to 10 seconds. Repeat as told by your health care provider. Repeat this exercise daily as told by your health care provider. Continue to do this exercise for at least 4-6 weeks, or for as long as told by your health care provider. You may be referred to a physical therapist who can help you learn more about how to do Kegel exercises. Depending on your condition, your health care provider may  recommend: Varying how long you squeeze your muscles. Doing several sets of exercises every day. Doing exercises for several weeks. Making Kegel exercises a part of your regular exercise routine. This information is not intended to replace advice given to you by your health care provider. Make sure you discuss any questions you have with your health care provider. Document Revised: 10/21/2020 Document Reviewed: 10/21/2020 Elsevier Patient Education  2022 [employer].

## 2021-06-08 NOTE — Progress Notes (Signed)
° °  06/08/2021 10:29 AM   Wayne Holland May 23, 1956 [id number]  Reason for visit: Follow up BPH and bladder stones status post HOLEP  HPI: 65 year old male with BPH, recurrent gross hematuria, urgency, frequency, weak stream, and multiple bladder stones who underwent an uncomplicated HOLEP on 08/81/1031 with removal of 61 g of benign tissue.  He had significant bladder trabeculations and diverticula at time of cystoscopy.  He is urinating with a strong stream and overall doing well.  He still has some occasional dysuria and mild hematuria with some scabbing, and mild stress incontinence.  He is wearing a depends still, but is getting ready to stop using this as it is minimally damp.  He is having nocturia 1-2 times per night.  PVR is normal today at 0 mL  Reassurance provided on postop expectations after HOLEP, consider a urinalysis and culture in the future if persistent symptoms.  Instructed on Kegel exercises, RTC 6 months PVR  Billey Co, MD  [facility] [address], [place], [place] [address] [phone]

## 2021-06-13 ENCOUNTER — Other Ambulatory Visit: Payer: Self-pay

## 2021-06-13 DIAGNOSIS — E7800 Pure hypercholesterolemia, unspecified: Secondary | ICD-10-CM

## 2021-06-13 NOTE — Telephone Encounter (Signed)
Copied from [place] [id number]. Topic: Quick Communication - Rx Refill/Question >> Jun 13, 2021 11:11 AM Tessa Lerner A wrote: Medication: pravastatin (PRAVACHOL) 20 MG tablet [[id number]  Has the patient contacted their pharmacy? Yes.  The patient has been in contact with their pharmacy and been directed to contact their PCP (Agent: If no, request that the patient contact the pharmacy for the refill. If patient does not wish to contact the pharmacy document the reason why and proceed with request.) (Agent: If yes, when and what did the pharmacy advise?)  Preferred Pharmacy (with phone number or street name): [facility] [place], [place] [facility] [place] [address] [place] [address] Phone: [phone] Fax: [phone]   Has the patient been seen for an appointment in the last year OR does the patient have an upcoming appointment? Yes.    Agent: Please be advised that RX refills may take up to 3 business days. We ask that you follow-up with your pharmacy.

## 2021-06-14 MED ORDER — PRAVASTATIN SODIUM 20 MG PO TABS: 20.0000 mg | ORAL_TABLET | Freq: Every day | ORAL | 0 refills | Status: DC

## 2021-06-14 NOTE — Telephone Encounter (Signed)
Requested Prescriptions  Pending Prescriptions Disp Refills   pravastatin (PRAVACHOL) 20 MG tablet 90 tablet 0    Sig: Take 1 tablet (20 mg total) by mouth daily.     Cardiovascular:  Antilipid - Statins Passed - 06/14/2021  3:16 PM      Passed - Total Cholesterol in normal range and within 360 days    Cholesterol, Total  Date Value Ref Range Status  10/19/2020 172 100 - 199 mg/dL Final         Passed - LDL in normal range and within 360 days    LDL Cholesterol (Calc)  Date Value Ref Range Status  05/10/2017 89 mg/dL (calc) Final    Comment:    Reference range: <100 . Desirable range <100 mg/dL for primary prevention;   <70 mg/dL for patients with CHD or diabetic patients  with > or = 2 CHD risk factors. Marland Kitchen LDL-C is now calculated using the Martin-Hopkins  calculation, which is a validated novel method providing  better accuracy than the Friedewald equation in the  estimation of LDL-C.  Cresenciano Genre et al. Annamaria Helling. 0102;725(36): 2061-2068  (http://education.[facility].com/faq/FAQ164)    LDL Chol Calc (NIH)  Date Value Ref Range Status  10/19/2020 94 0 - 99 mg/dL Final         Passed - HDL in normal range and within 360 days    HDL  Date Value Ref Range Status  10/19/2020 66 >39 mg/dL Final         Passed - Triglycerides in normal range and within 360 days    Triglycerides  Date Value Ref Range Status  10/19/2020 61 0 - 149 mg/dL Final         Passed - Patient is not pregnant      Passed - Valid encounter within last 12 months    Recent Outpatient Visits          4 months ago Elevated glucose   [employer] [place], Vickki Muff, PA-C   7 months ago Essential hypertension   [employer], Laurita Quint, FNP   10 months ago Pain of great toe, right   [employer], Vickki Muff, PA-C   1 year ago Essential hypertension   [employer] [facility], Vickki Muff, PA-C   1 year ago Elevated glucose   [place], Vickki Muff, [place]

## 2021-08-02 IMAGING — CT CT ABD-PEL WO/W CM
3 of 12 series · 11 of 46 positions shown, 17 images · IV contrast (omnipaque)
Comparison: None.

CLINICAL DATA: Gross hematuria.

EXAM:
CT ABDOMEN AND PELVIS WITHOUT AND WITH CONTRAST
TECHNIQUE: Multidetector CT imaging of the abdomen and pelvis was performed
following the standard protocol before and following the bolus
administration of intravenous contrast.
CONTRAST:  125mL OMNIPAQUE IOHEXOL 300 MG/ML  SOLN

[Series 2: axial pre · axial · non-contrast · 0.80mm/px · z∈[-509,-439]mm · 2 of 99 slices shown]
[im 15/99  soft-tissue]
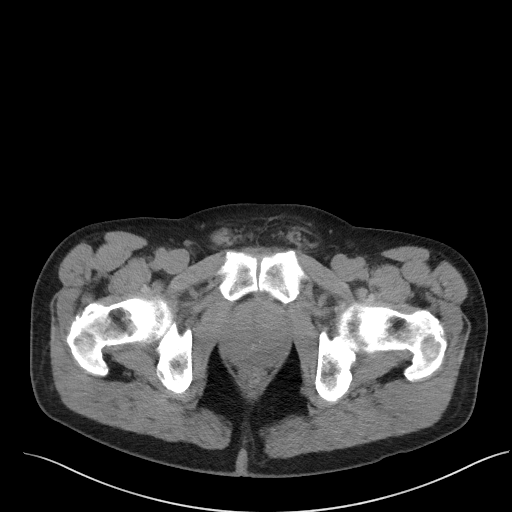
[im 29/99  soft-tissue]
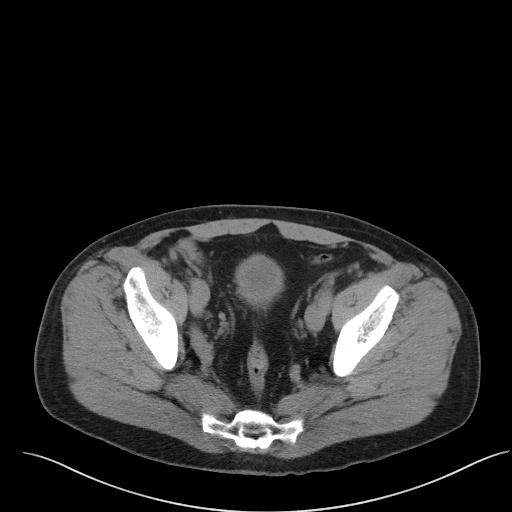

[Series 6: coronal pre · coronal · non-contrast · 0.71mm/px · 2 of 91 slices shown, 3 images]
[im 31/91  soft-tissue]
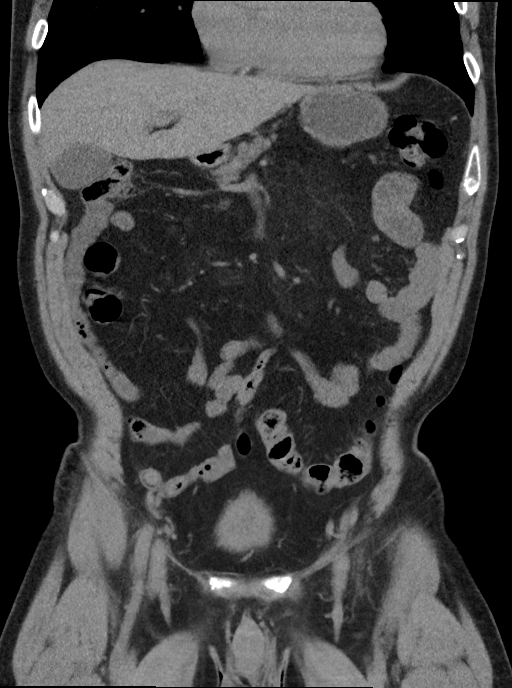
[im 31/91  bone]
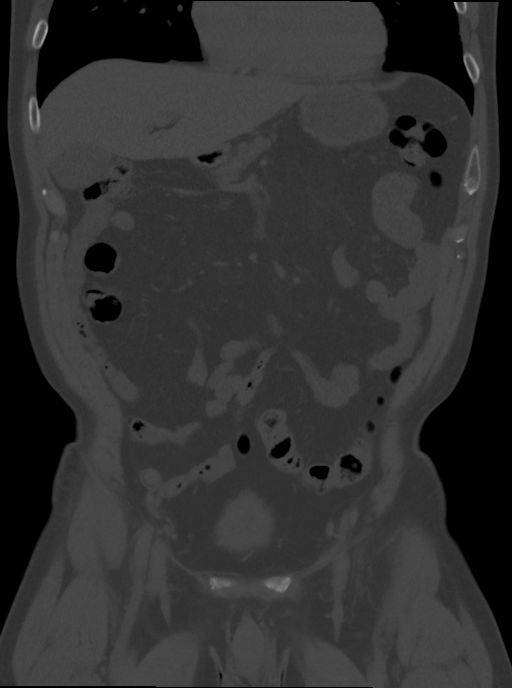
[im 61/91  soft-tissue]
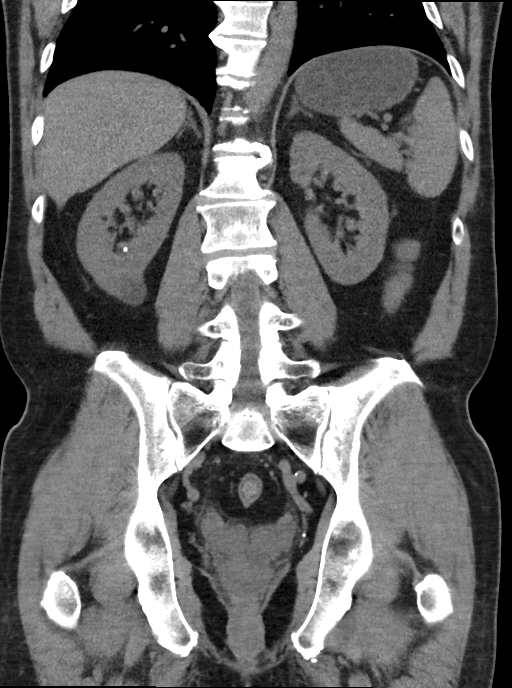

[Series 13: axial delay · axial · delayed · 0.83mm/px · z∈[-514,-144]mm · 7 of 100 slices shown, 12 images]
[im 13/100  soft-tissue]
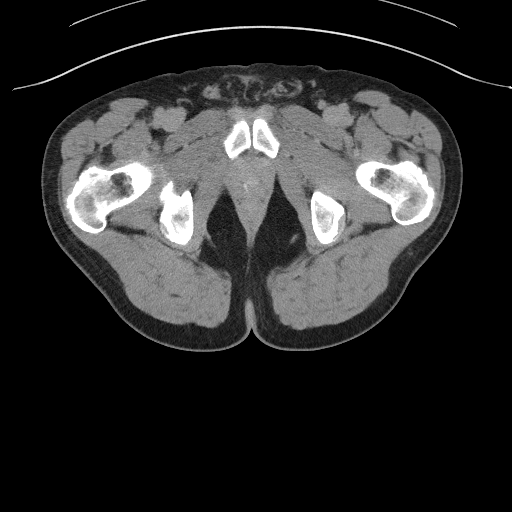
[im 13/100  bone]
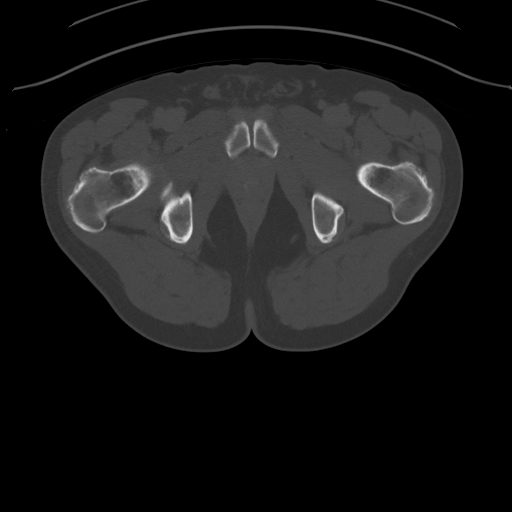
[im 25/100  soft-tissue]
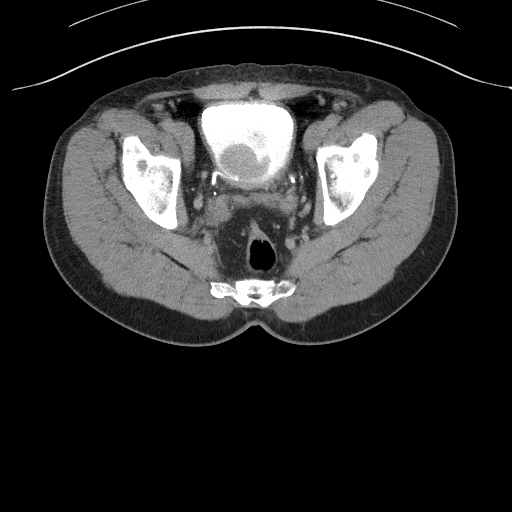
[im 38/100  soft-tissue]
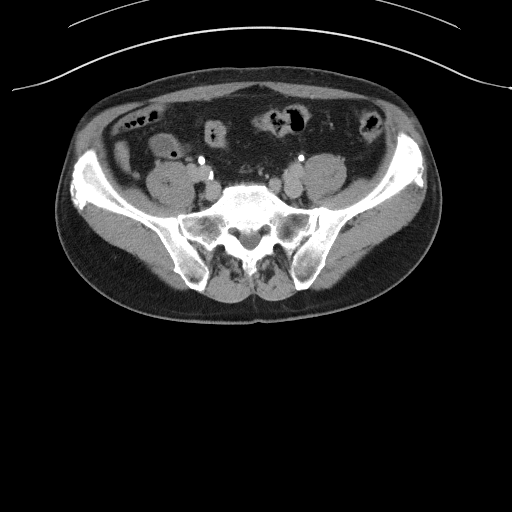
[im 50/100  soft-tissue]
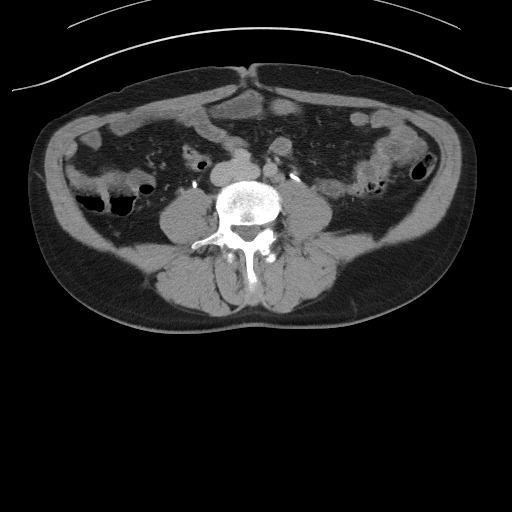
[im 50/100  lung]
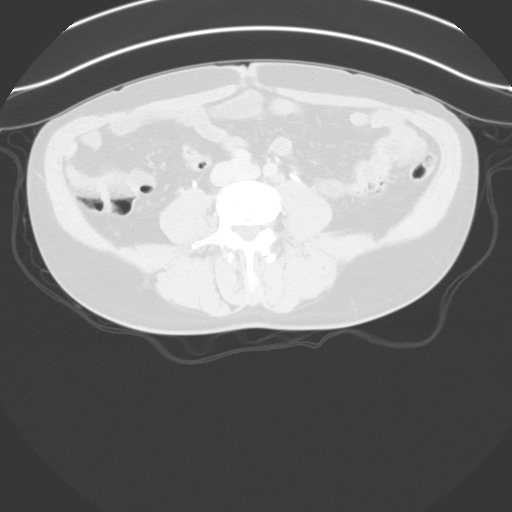
[im 62/100  soft-tissue]
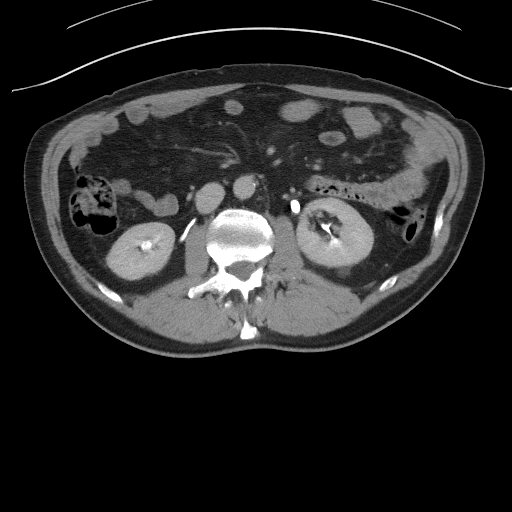
[im 62/100  lung]
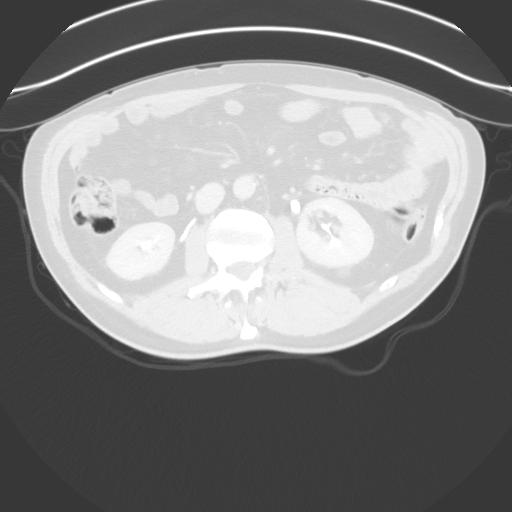
[im 75/100  soft-tissue]
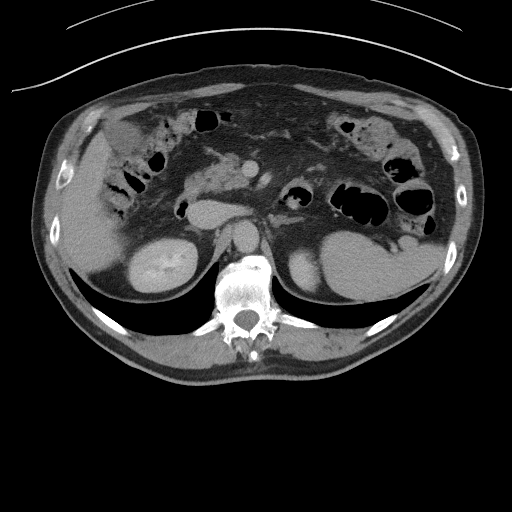
[im 75/100  lung]
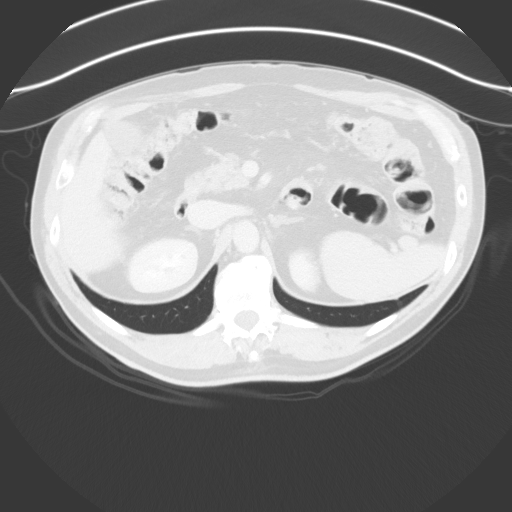
[im 87/100  soft-tissue]
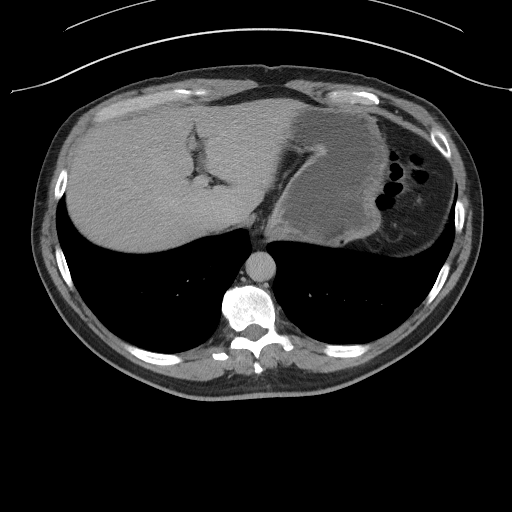
[im 87/100  lung]
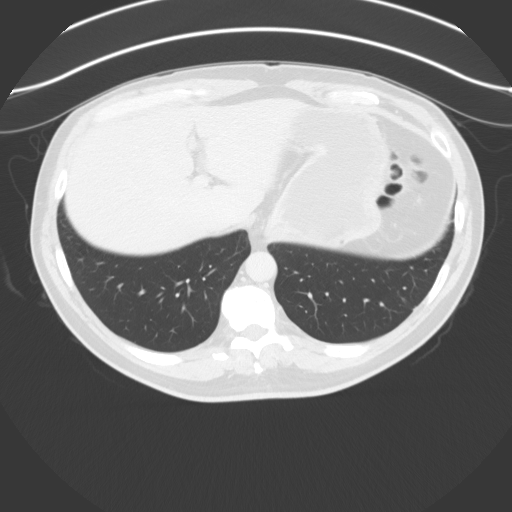

[11 of 46 positions shown; findings below may reference images not displayed]

FINDINGS: Lower Chest: Pulmonary nodule is seen in the right middle lobe
measuring 5 mm on image [DATE]. A 4 mm subpleural pulmonary nodule is
seen in the lateral left lower lobe on image [DATE].

Hepatobiliary: No hepatic masses identified. Gallbladder is
unremarkable. No evidence of biliary ductal dilatation.

Pancreas:  No mass or inflammatory changes.

Spleen: Within normal limits in size and appearance.

Adrenals/Urinary Tract: No adrenal masses identified. 3 mm calculus
is seen in the lower pole of the right kidney. No evidence of
ureteral calculi or hydronephrosis. Several calculi are seen in the
urinary bladder measuring up to 8 mm. A few small simple cysts are
seen in both kidneys. No complex cystic or solid renal masses
identified. No masses seen involving the collecting systems,
ureters, or bladder. Diffuse bladder wall thickening is seen, likely
due to chronic bladder outlet obstruction given enlarged prostate.

Stomach/Bowel: No evidence of obstruction, inflammatory process or
abnormal fluid collections. Normal appendix visualized.

Vascular/Lymphatic: No pathologically enlarged lymph nodes. No
abdominal aortic aneurysm. Aortic atherosclerotic calcification
noted.

Reproductive: Markedly enlarged prostate gland elevating the bladder
base.

Other:  None.

Musculoskeletal:  No suspicious bone lesions identified.
IMPRESSION: 3 mm right renal calculus. No evidence of ureteral calculi or
hydronephrosis.

Markedly enlarged prostate, with findings of chronic bladder outlet
obstruction. Several bladder calculi measuring up to 8 mm.

No radiographic evidence of urinary tract neoplasm.

Indeterminate pulmonary nodules in both lung bases, largest
measuring 5 mm. No follow-up needed if patient is low-risk (and has
no known or suspected primary neoplasm). Non-contrast chest CT can
be considered in 12 months if patient is high-risk. This
recommendation follows the consensus statement: Guidelines for
Management of Incidental Pulmonary Nodules Detected on CT Images:

Aortic Atherosclerosis ([id number]).

## 2021-09-08 ENCOUNTER — Other Ambulatory Visit: Payer: Self-pay | Admitting: Family Medicine

## 2021-09-09 NOTE — Telephone Encounter (Signed)
Requested Prescriptions  ?Pending Prescriptions Disp Refills  ?? pravastatin (PRAVACHOL) 20 MG tablet [Pharmacy Med Name: PRAVASTATIN SODIUM 20 MG TAB] 90 tablet 0  ?  Sig: TAKE 1 TABLET BY MOUTH EVERY DAY  ?  ? Cardiovascular:  Antilipid - Statins Failed - 09/08/2021  4:53 PM  ?  ?  Failed - Lipid Panel in normal range within the last 12 months  ?  Cholesterol, Total  ?Date Value Ref Range Status  ?10/19/2020 172 100 - 199 mg/dL Final  ? ?LDL Cholesterol (Calc)  ?Date Value Ref Range Status  ?05/10/2017 89 mg/dL (calc) Final  ?  Comment:  ?  Reference range: <100 ?Marland Kitchen ?Desirable range <100 mg/dL for primary prevention;   ?<70 mg/dL for patients with CHD or diabetic patients  ?with > or = 2 CHD risk factors. ?. ?LDL-C is now calculated using the Martin-Hopkins  ?calculation, which is a validated novel method providing  ?better accuracy than the Friedewald equation in the  ?estimation of LDL-C.  ?Cresenciano Genre et al. Annamaria Helling. 1937;902(40): 2061-2068  ?(http://education.[facility].com/faq/FAQ164) ?  ? ?LDL Chol Calc (NIH)  ?Date Value Ref Range Status  ?10/19/2020 94 0 - 99 mg/dL Final  ? ?HDL  ?Date Value Ref Range Status  ?10/19/2020 66 >39 mg/dL Final  ? ?Triglycerides  ?Date Value Ref Range Status  ?10/19/2020 61 0 - 149 mg/dL Final  ? ?  ?  ?  Passed - Patient is not pregnant  ?  ?  Passed - Valid encounter within last 12 months  ?  Recent Outpatient Visits   ?      ? 7 months ago Elevated glucose  ? [place], PA-C  ? 10 months ago Essential hypertension  ? [employer] [facility], Laurita Quint, FNP  ? 1 year ago Pain of great toe, right  ? [place], PA-C  ? 1 year ago Essential hypertension  ? [place], PA-C  ? 1 year ago Elevated glucose  ? [place], PA-C  ?  ?  ? ?  ?  ?  ? ?

## 2021-11-17 IMAGING — CR DG FOOT COMPLETE 3+V*R*
1 series · 3 of 3 positions shown · non-contrast
Comparison: None.

CLINICAL DATA: 64-year-old male with redness and pain in the first
MTP joint.

EXAM:
RIGHT FOOT COMPLETE - 3+ VIEW

[Series 1: dg foot complete right · 0.14mm/px · 3 of 3 slices shown]
[im 1/3]
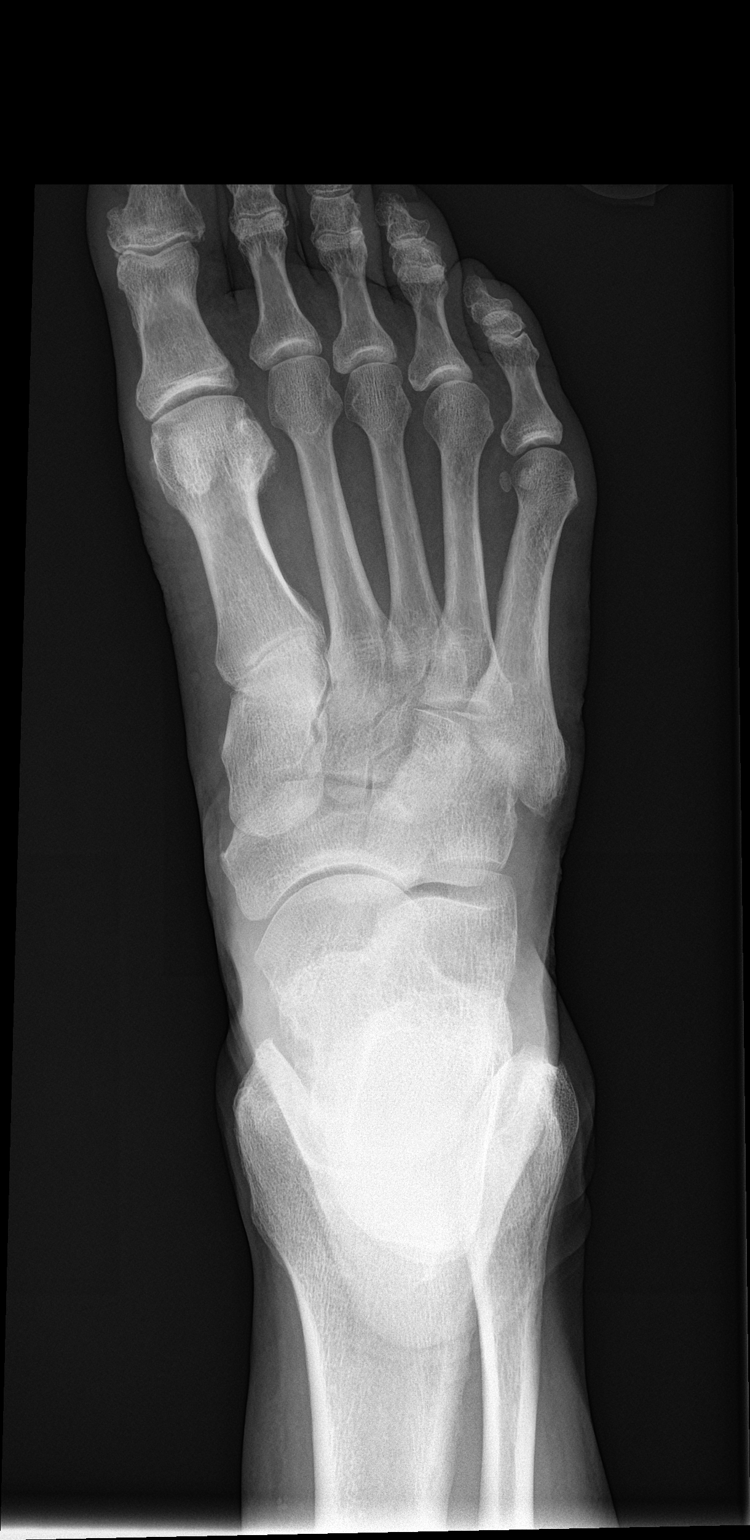
[im 2/3]
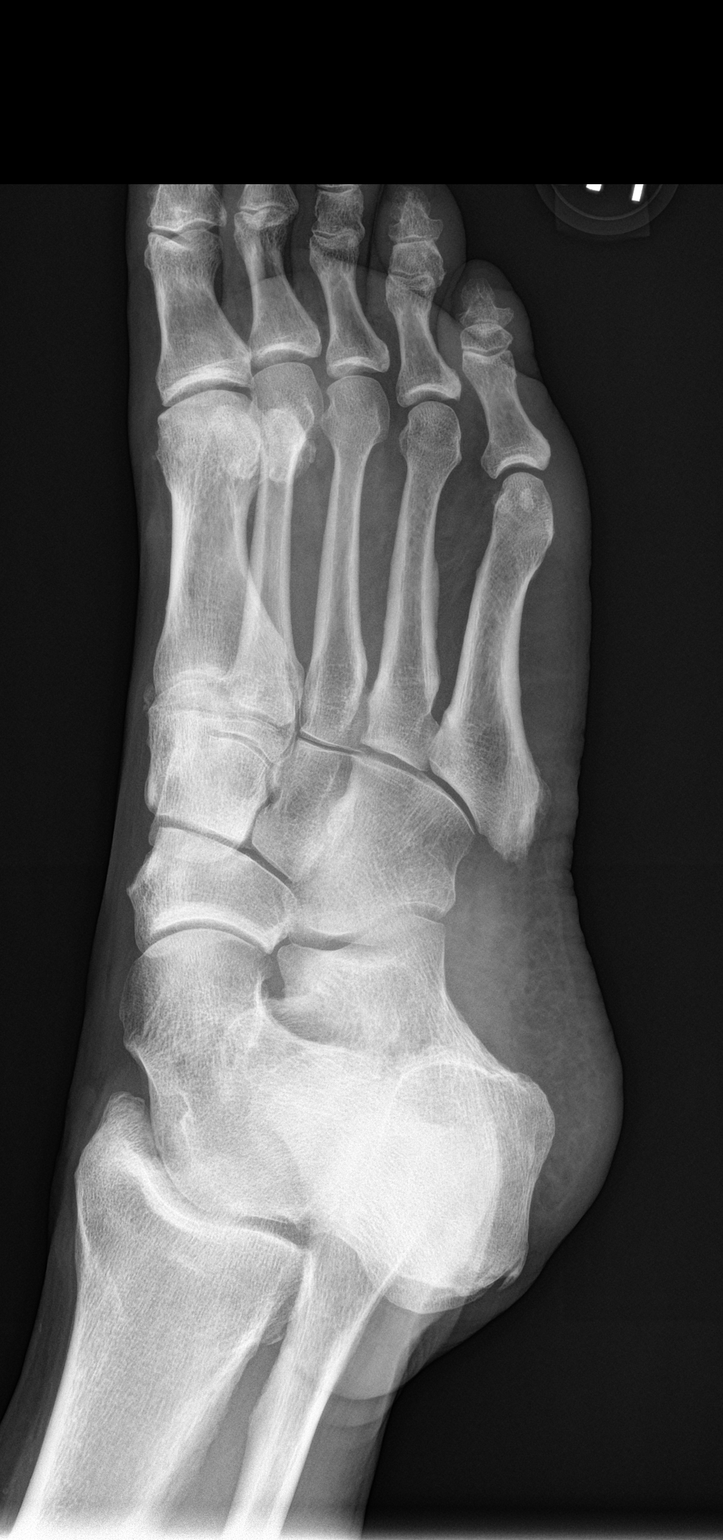
[im 3/3]
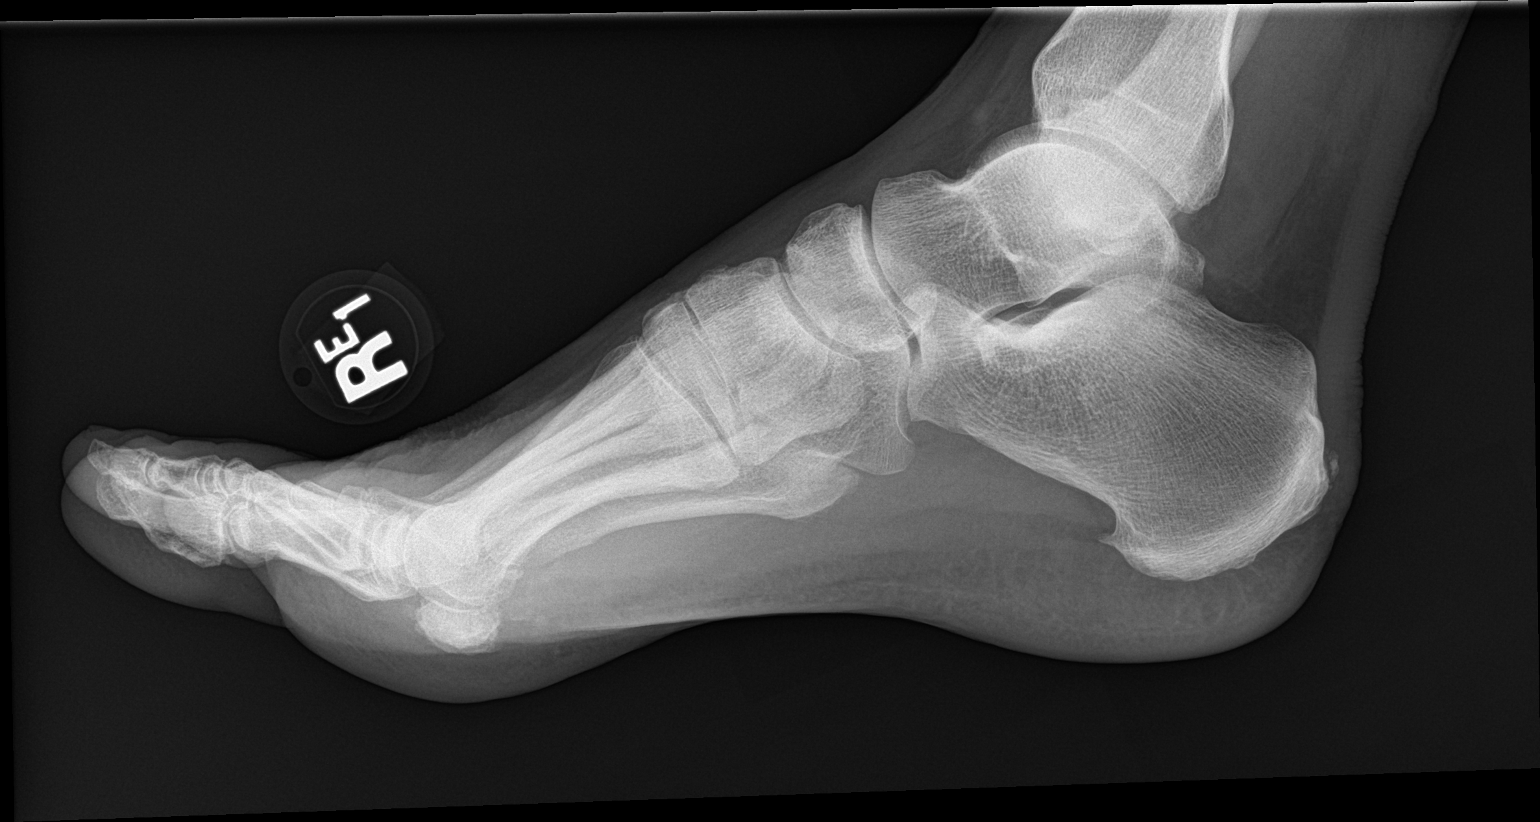

[3 of 3 positions shown; findings below may reference images not displayed]

FINDINGS: There is no acute fracture or dislocation. The bones are well
mineralized. No significant arthritic changes. The soft tissues are
unremarkable.
IMPRESSION: Negative.

## 2021-11-28 DIAGNOSIS — D2272 Melanocytic nevi of left lower limb, including hip: Secondary | ICD-10-CM | POA: Diagnosis not present

## 2021-11-28 DIAGNOSIS — D2262 Melanocytic nevi of left upper limb, including shoulder: Secondary | ICD-10-CM | POA: Diagnosis not present

## 2021-11-28 DIAGNOSIS — D2261 Melanocytic nevi of right upper limb, including shoulder: Secondary | ICD-10-CM | POA: Diagnosis not present

## 2021-11-28 DIAGNOSIS — D225 Melanocytic nevi of trunk: Secondary | ICD-10-CM | POA: Diagnosis not present

## 2021-12-06 ENCOUNTER — Other Ambulatory Visit: Payer: Self-pay | Admitting: Family Medicine

## 2021-12-06 DIAGNOSIS — E7800 Pure hypercholesterolemia, unspecified: Secondary | ICD-10-CM

## 2021-12-07 ENCOUNTER — Ambulatory Visit: Payer: Medicare Other | Admitting: Urology

## 2021-12-07 ENCOUNTER — Encounter: Payer: Self-pay | Admitting: Urology

## 2021-12-07 VITALS — BP 133/81 | HR 65 | Ht 71.0 in | Wt 175.8 lb

## 2021-12-07 DIAGNOSIS — N401 Enlarged prostate with lower urinary tract symptoms: Secondary | ICD-10-CM

## 2021-12-07 DIAGNOSIS — N138 Other obstructive and reflux uropathy: Secondary | ICD-10-CM | POA: Diagnosis not present

## 2021-12-07 LAB — BLADDER SCAN AMB NON-IMAGING

## 2021-12-07 NOTE — Progress Notes (Signed)
   12/07/2021 11:31 AM   Wayne Holland 25-Jun-1956 [id number]  Reason for visit: Follow up BPH and bladder stones status post HOLEP  HPI: 66 year old male with BPH, recurrent gross hematuria, urgency, frequency, weak stream, and multiple bladder stones who underwent an uncomplicated HOLEP on 72/02/4708 with removal of 61 g of benign tissue.  He had significant bladder trabeculations and diverticula at time of cystoscopy.  He is urinating with a strong stream and overall doing extremely well.  He is voiding with a strong stream and denies any incontinence.  No further gross hematuria.  PVR is normal today at 0 mL  RTC 1 year PVR, can follow-up as needed at that time if doing well  Billey Co, [place] [address] [place], [place] [address] [phone]

## 2021-12-12 NOTE — Progress Notes (Unsigned)
I,Roshena L Chambers,acting as a Education administrator for [employer], PA-C.,have documented all relevant documentation on the behalf of Mardene Speak, PA-C,as directed by  [employer], PA-C while in the presence of [employer], PA-C.    Established patient visit   Patient: Wayne Holland   DOB: 1955-08-07   66 y.o. Male  MRN: [id number] Visit Date: 12/13/2021  Today's healthcare provider: Mardene Speak, PA-C   Chief Complaint  Patient presents with   Hypertension   Hyperlipidemia   Hyperglycemia   Subjective    HPI  Hypertension, follow-up  BP Readings from Last 3 Encounters:  12/13/21 133/73  12/07/21 133/81  06/08/21 123/77   Wt Readings from Last 3 Encounters:  12/13/21 175 lb (79.4 kg)  12/07/21 175 lb 12.8 oz (79.7 kg)  06/08/21 180 lb (81.6 kg)     He was last seen for hypertension 11 months ago.  BP at that visit was 109/73. Management since that visit includes continue same medication.  He reports good compliance with treatment. He is not having side effects.  He is following a Regular diet. He is exercising. He does not smoke.  Use of agents associated with hypertension: none.   Outside blood pressures are not checked. Symptoms: No chest pain No chest pressure  No palpitations No syncope  No dyspnea No orthopnea  No paroxysmal nocturnal dyspnea No lower extremity edema   Pertinent labs Lab Results  Component Value Date   CHOL 172 10/19/2020   HDL 66 10/19/2020   LDLCALC 94 10/19/2020   TRIG 61 10/19/2020   CHOLHDL 2.6 10/19/2020   Lab Results  Component Value Date   NA 138 04/06/2021   K 4.0 04/06/2021   CREATININE 0.92 04/06/2021   GFRNONAA >60 04/06/2021   GLUCOSE 110 (H) 04/06/2021   TSH 0.562 01/20/2021     The 10-year ASCVD risk score (Arnett DK, et al., 2019) is: 16.7%  ---------------------------------------------------------------------------------------------------   Hyperglycemia, Follow-up  Lab Results  Component Value  Date   HGBA1C 5.7 (A) 01/20/2021   HGBA1C 6.5 (A) 10/19/2020   HGBA1C 6.0 (H) 04/19/2020   GLUCOSE 110 (H) 04/06/2021   GLUCOSE 104 (H) 01/20/2021   GLUCOSE 120 (H) 10/19/2020    Last seen for for this 11 months ago.  Management since that visit includes continue lifestyle modification. Current symptoms include none and have been stable.  Prior visit with dietician: no Current diet: well balanced Current exercise: walking  Pertinent Labs:    Component Value Date/Time   CHOL 172 10/19/2020 1021   TRIG 61 10/19/2020 1021   CHOLHDL 2.6 10/19/2020 1021   CHOLHDL 2.7 05/10/2017 1030   CREATININE 0.92 04/06/2021 0836   CREATININE 0.91 05/10/2017 1030    Wt Readings from Last 3 Encounters:  12/13/21 175 lb (79.4 kg)  12/07/21 175 lb 12.8 oz (79.7 kg)  06/08/21 180 lb (81.6 kg)    -----------------------------------------------------------------------------------------   Lipid/Cholesterol, Follow-up  Last lipid panel Other pertinent labs  Lab Results  Component Value Date   CHOL 172 10/19/2020   HDL 66 10/19/2020   LDLCALC 94 10/19/2020   TRIG 61 10/19/2020   CHOLHDL 2.6 10/19/2020   Lab Results  Component Value Date   ALT 34 01/20/2021   AST 20 01/20/2021   PLT 213 04/06/2021   TSH 0.562 01/20/2021     He was last seen for this 11 months ago.  Management since that visit includes continue same medication.  He reports good compliance with treatment. He  is not having side effects.   Symptoms: No chest pain No chest pressure/discomfort  No dyspnea No lower extremity edema  No numbness or tingling of extremity No orthopnea  No palpitations No paroxysmal nocturnal dyspnea  No speech difficulty No syncope   Current diet: well balanced Current exercise: walking  The 10-year ASCVD risk score (Arnett DK, et al., 2019) is: 16.7%  ---------------------------------------------------------------------------------------------------   Medications: Outpatient  Medications Prior to Visit  Medication Sig   lisinopril (ZESTRIL) 5 MG tablet Take 1 tablet (5 mg total) by mouth daily.   pravastatin (PRAVACHOL) 20 MG tablet TAKE 1 TABLET BY MOUTH EVERY DAY   No facility-administered medications prior to visit.    Review of Systems  Constitutional:  Negative for appetite change, chills and fever.  Respiratory:  Negative for chest tightness, shortness of breath and wheezing.   Cardiovascular:  Negative for chest pain and palpitations.  Gastrointestinal:  Negative for abdominal pain, nausea and vomiting.    {Labs  Heme  Chem  Endocrine  Serology  Results Review (optional):23779}   Objective    BP 133/73 (BP Location: Right Arm, Patient Position: Sitting, Cuff Size: Normal)   Pulse 60   Temp 97.8 F (36.6 C) (Oral)   Resp 16   Wt 175 lb (79.4 kg)   SpO2 100% Comment: room air  BMI 24.41 kg/m  {Show previous vital signs (optional):23777}  Physical Exam Vitals reviewed.  Constitutional:      General: He is not in acute distress.    Appearance: Normal appearance. He is well-developed. He is not diaphoretic.  HENT:     Head: Normocephalic and atraumatic.     Right Ear: Tympanic membrane, ear canal and external ear normal.     Left Ear: Tympanic membrane, ear canal and external ear normal.     Nose: Nose normal.     Mouth/Throat:     Mouth: Mucous membranes are moist.     Pharynx: Oropharynx is clear. No oropharyngeal exudate.  Eyes:     General: No scleral icterus.    Conjunctiva/sclera: Conjunctivae normal.     Pupils: Pupils are equal, round, and reactive to light.  Neck:     Thyroid: No thyromegaly.  Cardiovascular:     Rate and Rhythm: Normal rate and regular rhythm.     Pulses: Normal pulses.     Heart sounds: Normal heart sounds. No murmur heard. Pulmonary:     Effort: Pulmonary effort is normal. No respiratory distress.     Breath sounds: Normal breath sounds. No wheezing or rales.  Abdominal:     General: There is no  distension.     Palpations: Abdomen is soft.     Tenderness: There is no abdominal tenderness.  Musculoskeletal:        General: No deformity.     Cervical back: Neck supple.     Right lower leg: No edema.     Left lower leg: No edema.  Lymphadenopathy:     Cervical: No cervical adenopathy.  Skin:    General: Skin is warm and dry.     Findings: No rash.  Neurological:     Mental Status: He is alert and oriented to person, place, and time. Mental status is at baseline.     Sensory: No sensory deficit.     Motor: No weakness.     Gait: Gait normal.  Psychiatric:        Mood and Affect: Mood normal.  Behavior: Behavior normal.        Thought Content: Thought content normal.     ***  No results found for any visits on 12/13/21.  Assessment & Plan     ***  No follow-ups on file.      {provider attestation***:1}   Mardene Speak, Hershal Coria  [facility] [phone] (phone) [phone] (fax)  [place]

## 2021-12-13 ENCOUNTER — Encounter: Payer: Self-pay | Admitting: Physician Assistant

## 2021-12-13 ENCOUNTER — Ambulatory Visit (INDEPENDENT_AMBULATORY_CARE_PROVIDER_SITE_OTHER): Payer: Medicare Other | Admitting: Physician Assistant

## 2021-12-13 VITALS — BP 133/73 | HR 60 | Temp 97.8°F | Resp 16 | Wt 175.0 lb

## 2021-12-13 DIAGNOSIS — R7989 Other specified abnormal findings of blood chemistry: Secondary | ICD-10-CM | POA: Diagnosis not present

## 2021-12-13 DIAGNOSIS — R7309 Other abnormal glucose: Secondary | ICD-10-CM

## 2021-12-13 DIAGNOSIS — E7800 Pure hypercholesterolemia, unspecified: Secondary | ICD-10-CM

## 2021-12-13 DIAGNOSIS — I10 Essential (primary) hypertension: Secondary | ICD-10-CM | POA: Diagnosis not present

## 2021-12-13 MED ORDER — LISINOPRIL 5 MG PO TABS: 5.0000 mg | ORAL_TABLET | Freq: Every day | ORAL | 3 refills | Status: DC

## 2021-12-13 MED ORDER — PRAVASTATIN SODIUM 20 MG PO TABS: 20.0000 mg | ORAL_TABLET | Freq: Every day | ORAL | 3 refills | Status: DC

## 2021-12-14 LAB — CBC WITH DIFFERENTIAL/PLATELET
Basophils Absolute: 0 10*3/uL (ref 0.0–0.2)
Basos: 1 %
EOS (ABSOLUTE): 0.1 10*3/uL (ref 0.0–0.4)
Eos: 2 %
Hematocrit: 47.7 % (ref 37.5–51.0)
Hemoglobin: 16.8 g/dL (ref 13.0–17.7)
Immature Grans (Abs): 0 10*3/uL (ref 0.0–0.1)
Immature Granulocytes: 0 %
Lymphocytes Absolute: 1.5 10*3/uL (ref 0.7–3.1)
Lymphs: 29 %
MCH: 32.2 pg (ref 26.6–33.0)
MCHC: 35.2 g/dL (ref 31.5–35.7)
MCV: 92 fL (ref 79–97)
Monocytes Absolute: 0.5 10*3/uL (ref 0.1–0.9)
Monocytes: 9 %
Neutrophils Absolute: 3.1 10*3/uL (ref 1.4–7.0)
Neutrophils: 59 %
Platelets: 252 10*3/uL (ref 150–450)
RBC: 5.21 x10E6/uL (ref 4.14–5.80)
RDW: 12.1 % (ref 11.6–15.4)
WBC: 5.2 10*3/uL (ref 3.4–10.8)

## 2021-12-14 LAB — COMPREHENSIVE METABOLIC PANEL
ALT: 34 IU/L (ref 0–44)
AST: 24 IU/L (ref 0–40)
Albumin/Globulin Ratio: 2.6 — ABNORMAL HIGH (ref 1.2–2.2)
Albumin: 4.9 g/dL — ABNORMAL HIGH (ref 3.8–4.8)
Alkaline Phosphatase: 75 IU/L (ref 44–121)
BUN/Creatinine Ratio: 15 (ref 10–24)
BUN: 15 mg/dL (ref 8–27)
Bilirubin Total: 0.5 mg/dL (ref 0.0–1.2)
CO2: 21 mmol/L (ref 20–29)
Calcium: 9.4 mg/dL (ref 8.6–10.2)
Chloride: 100 mmol/L (ref 96–106)
Creatinine, Ser: 1.02 mg/dL (ref 0.76–1.27)
Globulin, Total: 1.9 g/dL (ref 1.5–4.5)
Glucose: 109 mg/dL — ABNORMAL HIGH (ref 70–99)
Potassium: 4.8 mmol/L (ref 3.5–5.2)
Sodium: 137 mmol/L (ref 134–144)
Total Protein: 6.8 g/dL (ref 6.0–8.5)
eGFR: 82 mL/min/{1.73_m2} (ref >59)

## 2021-12-14 LAB — TSH: TSH: 0.345 u[IU]/mL — ABNORMAL LOW (ref 0.450–4.500)

## 2021-12-14 LAB — LIPID PANEL
Chol/HDL Ratio: 3.1 ratio (ref 0.0–5.0)
Cholesterol, Total: 187 mg/dL (ref 100–199)
HDL: 60 mg/dL (ref >39)
LDL Chol Calc (NIH): 112 mg/dL — ABNORMAL HIGH (ref 0–99)
Triglycerides: 81 mg/dL (ref 0–149)
VLDL Cholesterol Cal: 15 mg/dL (ref 5–40)

## 2021-12-14 LAB — HEMOGLOBIN A1C
Est. average glucose Bld gHb Est-mCnc: 120 mg/dL
Hgb A1c MFr Bld: 5.8 % — ABNORMAL HIGH (ref 4.8–5.6)

## 2022-01-03 ENCOUNTER — Ambulatory Visit (INDEPENDENT_AMBULATORY_CARE_PROVIDER_SITE_OTHER): Payer: Medicare Other

## 2022-01-03 VITALS — BP 124/76 | Ht 71.0 in | Wt 181.7 lb

## 2022-01-03 DIAGNOSIS — Z0000 Encounter for general adult medical examination without abnormal findings: Secondary | ICD-10-CM

## 2022-01-03 NOTE — Progress Notes (Signed)
Subjective:   Wayne Holland is a 66 y.o. male who presents for an Initial Medicare Annual Wellness Visit.  Review of Systems           Objective:    Today's Vitals   01/03/22 0811  BP: 124/76  Weight: 181 lb 11.2 oz (82.4 kg)  Height: '5\' 11"'$  (1.803 m)   Body mass index is 25.34 kg/m.     04/08/2021    7:13 AM 04/05/2021   11:24 AM  Advanced Directives  Does Patient Have a Medical Advance Directive? No No  Would patient like information on creating a medical advance directive? No - Patient declined     Current Medications (verified) Outpatient Encounter Medications as of 01/03/2022  Medication Sig   lisinopril (ZESTRIL) 5 MG tablet Take 1 tablet (5 mg total) by mouth daily.   pravastatin (PRAVACHOL) 20 MG tablet Take 1 tablet (20 mg total) by mouth daily.   No facility-administered encounter medications on file as of 01/03/2022.    Allergies (verified) Patient has no known allergies.   History: Past Medical History:  Diagnosis Date   History of kidney stones    Hypertension    Pre-diabetes    Sleep apnea    Past Surgical History:  Procedure Laterality Date   COLONOSCOPY  2007   COLONOSCOPY WITH PROPOFOL N/A 06/28/2016   Procedure: COLONOSCOPY WITH PROPOFOL;  Surgeon: [name], MD;  Location: [facility];  Service: Endoscopy;  Laterality: N/A;   CYSTOSCOPY WITH LITHOLAPAXY N/A 04/08/2021   Procedure: CYSTOSCOPY WITH LITHOLAPAXY;  Surgeon: Billey Co, MD;  Location: [facility] ORS;  Service: Urology;  Laterality: N/A;   HOLEP-LASER ENUCLEATION OF THE PROSTATE WITH MORCELLATION N/A 04/08/2021   Procedure: HOLEP-LASER ENUCLEATION OF THE PROSTATE WITH MORCELLATION;  Surgeon: Billey Co, MD;  Location: [facility] OR[facility];  Service: Urology;  Laterality: N/A;   NO PAST SURGERIES     Family History  Problem Relation Age of Onset   Dementia Mother    Ovarian cancer Mother    Stroke Father    Diabetes Father    Colon cancer Neg Hx    Social  History   Socioeconomic History   Marital status: Married    Spouse name: Olin Hauser   Number of children: Not on file   Years of education: Not on file   Highest education level: Not on file  Occupational History   Not on file  Tobacco Use   Smoking status: Former    Years: 4.00    Types: Cigarettes   Smokeless tobacco: Never   Tobacco comments:    QUIT IN 1970'S  Substance and Sexual Activity   Alcohol use: Yes    Alcohol/week: 0.0 standard drinks of alcohol    Comment: OCCASIONALLY   Drug use: No   Sexual activity: Yes    Birth control/protection: None  Other Topics Concern   Not on file  Social History Narrative   Not on file   Social Determinants of Health   Financial Resource Strain: Not on file  Food Insecurity: Not on file  Transportation Needs: Not on file  Physical Activity: Sufficiently Active (01/03/2022)   Exercise Vital Sign    Days of Exercise per Week: 4 days    Minutes of Exercise per Session: 50 min  Stress: No Stress Concern Present (01/03/2022)   [place]    Feeling of Stress : Not at all  Social Connections: Not on file  Tobacco Counseling Counseling given: Not Answered Tobacco comments: QUIT IN 1970'S   Clinical Intake:  Pre-visit preparation completed: Yes  Pain : No/denies pain     Nutritional Risks: None Diabetes: No  How often do you need to have someone help you when you read instructions, pamphlets, or other written materials from your doctor or pharmacy?: 1 - Never  Diabetic?no  Interpreter Needed?: No  Information entered by :: Kirke Shaggy, LPN   Activities of Daily Living    12/30/2021    8:29 PM 12/13/2021    8:47 AM  In your present state of health, do you have any difficulty performing the following activities:  Hearing? 0 0  Vision? 0 0  Difficulty concentrating or making decisions? 0 0  Walking or climbing stairs? 0 0  Dressing or bathing? 0  0  Doing errands, shopping? 0 0  Preparing Food and eating ? N   Using the Toilet? N   In the past six months, have you accidently leaked urine? N   Do you have problems with loss of bowel control? N   Managing your Medications? N   Managing your Finances? N   Housekeeping or managing your Housekeeping? N     Patient Care Team: Chrismon, Vickki Muff, PA-C (Inactive) as PCP - General (Physician Assistant) Chrismon, Vickki Muff, PA-C (Inactive) (Family Medicine) Bary Castilla Forest Gleason, MD (General Surgery)  Indicate any recent Medical Services you may have received from other than [facility] providers in the past year (date may be approximate).     Assessment:   This is a routine wellness examination for [place].  Hearing/Vision screen No results found.  Dietary issues and exercise activities discussed:     Goals Addressed             This Visit's Progress    DIET - EAT MORE FRUITS AND VEGETABLES         Depression Screen    01/03/2022    8:15 AM 12/13/2021    8:47 AM 01/20/2021    8:42 AM 10/19/2020   10:02 AM 07/22/2020   10:06 AM 05/15/2019   11:01 AM 05/13/2018   10:40 AM  PHQ 2/9 Scores  PHQ - 2 Score 0 0 0 0 0 0 0  PHQ- 9 Score 0 0 0 0 0      Fall Risk    12/30/2021    8:29 PM 12/13/2021    8:47 AM 01/20/2021    8:41 AM 10/19/2020   10:01 AM 07/22/2020   10:06 AM  Fall Risk   Falls in the past year? 0 0 0 0 0  Number falls in past yr: 0 0 0 0 0  Injury with Fall? 0 0 0 0 0  Risk for fall due to :  No Fall Risks No Fall Risks    Follow up  Falls evaluation completed       FALL RISK PREVENTION PERTAINING TO THE HOME:  Any stairs in or around the home? Yes  If so, are there any without handrails? No  Home free of loose throw rugs in walkways, pet beds, electrical cords, etc? Yes  Adequate lighting in your home to reduce risk of falls? Yes   ASSISTIVE DEVICES UTILIZED TO PREVENT FALLS:  Life alert? No  Use of a cane, walker or w/c? No  Grab bars in the bathroom? No   Shower chair or bench in shower? No  Elevated toilet seat or a handicapped toilet? No   TIMED UP  AND GO:  Was the test performed? Yes .  Length of time to ambulate 10 feet: 4 sec.   Gait steady and fast without use of assistive device  Cognitive Function:        Immunizations Immunization History  Administered Date(s) Administered   Influenza Split 04/09/2012   Influenza, High Dose Seasonal PF 05/08/2021   Influenza,inj,Quad PF,6+ Mos 04/17/2013, 04/24/2014, 04/26/2015, 05/04/2016, 05/10/2017, 05/13/2018, 03/27/2019, 05/11/2020   Influenza-Unspecified 03/27/2019   PFIZER Comirnaty(Gray Top)Covid-19 Tri-Sucrose Vaccine 11/15/2020   PFIZER(Purple Top)SARS-COV-2 Vaccination 08/31/2019, 09/22/2019, 05/19/2020   Tdap 04/07/2011   Zoster, Live 05/04/2016    TDAP status: Due, Education has been provided regarding the importance of this vaccine. Advised may receive this vaccine at local pharmacy or Health Dept. Aware to provide a copy of the vaccination record if obtained from local pharmacy or Health Dept. Verbalized acceptance and understanding.  Flu Vaccine status: Up to date  Pneumococcal vaccine status: Declined,  Education has been provided regarding the importance of this vaccine but patient still declined. Advised may receive this vaccine at local pharmacy or Health Dept. Aware to provide a copy of the vaccination record if obtained from local pharmacy or Health Dept. Verbalized acceptance and understanding.   Covid-19 vaccine status: Completed vaccines  Qualifies for Shingles Vaccine? Yes   Zostavax completed Yes   Shingrix Completed?: No.    Education has been provided regarding the importance of this vaccine. Patient has been advised to call insurance company to determine out of pocket expense if they have not yet received this vaccine. Advised may also receive vaccine at local pharmacy or Health Dept. Verbalized acceptance and understanding.  Screening Tests Health  Maintenance  Topic Date Due   Pneumonia Vaccine 11+ Years old (1 - PCV) Never done   Zoster Vaccines- Shingrix (1 of 2) Never done   COVID-19 Vaccine (5 - Pfizer series) 01/10/2021   TETANUS/TDAP  04/06/2021   INFLUENZA VACCINE  01/24/2022   COLONOSCOPY (Pts 45-44yr Insurance coverage will need to be confirmed)  06/28/2026   Hepatitis C Screening  Completed   HPV VACCINES  Aged Out    Health Maintenance  Health Maintenance Due  Topic Date Due   Pneumonia Vaccine 66 Years old (1 - PCV) Never done   Zoster Vaccines- Shingrix (1 of 2) Never done   COVID-19 Vaccine (5 - Pfizer series) 01/10/2021   TETANUS/TDAP  04/06/2021    Colorectal cancer screening: Type of screening: Colonoscopy. Completed 06/28/16. Repeat every 10 years  Lung Cancer Screening: (Low Dose CT Chest recommended if Age 41101-80years, 30 pack-year currently smoking OR have quit w/in 15years.) does not qualify.   Additional Screening:  Hepatitis C Screening: does qualify; Completed 04/11/12  Vision Screening: Recommended annual ophthalmology exams for early detection of glaucoma and other disorders of the eye. Is the patient up to date with their annual eye exam?  Yes  Who is the provider or what is the name of the office in which the patient attends annual eye exams? Dr.Nice If pt is not established with a provider, would they like to be referred to a provider to establish care? No .   Dental Screening: Recommended annual dental exams for proper oral hygiene  Community Resource Referral / Chronic Care Management: CRR required this visit?  No   CCM required this visit?  No      Plan:     I have personally reviewed and noted the following in the patient's chart:   Medical and social  history Use of alcohol, tobacco or illicit drugs  Current medications and supplements including opioid prescriptions. Patient is not currently taking opioid prescriptions. Functional ability and status Nutritional  status Physical activity Advanced directives List of other physicians Hospitalizations, surgeries, and ER visits in previous 12 months Vitals Screenings to include cognitive, depression, and falls Referrals and appointments  In addition, I have reviewed and discussed with patient certain preventive protocols, quality metrics, and best practice recommendations. A written personalized care plan for preventive services as well as general preventive health recommendations were provided to patient.     Dionisio David, LPN   5/83/4621   Nurse Notes: none

## 2022-01-03 NOTE — Patient Instructions (Signed)
Wayne Holland , Thank you for taking time to come for your Medicare Wellness Visit. I appreciate your ongoing commitment to your health goals. Please review the following plan we discussed and let me know if I can assist you in the future.   Screening recommendations/referrals: Colonoscopy: 06/28/16 Recommended yearly ophthalmology/optometry visit for glaucoma screening and checkup Recommended yearly dental visit for hygiene and checkup  Vaccinations: Influenza vaccine: 05/08/21 Pneumococcal vaccine: n/d Tdap vaccine: 04/07/11, due if have injury Shingles vaccine: Zostavax 05/04/16   Covid-19: 08/31/19, 09/22/19, 05/19/20, 11/15/20  Advanced directives: no, given info  Conditions/risks identified: no  Next appointment: Follow up in one year for your annual wellness visit. 01/08/23 @ 8:15 am in person  Preventive Care 65 Years and Older, Male Preventive care refers to lifestyle choices and visits with your health care provider that can promote health and wellness. What does preventive care include? A yearly physical exam. This is also called an annual well check. Dental exams once or twice a year. Routine eye exams. Ask your health care provider how often you should have your eyes checked. Personal lifestyle choices, including: Daily care of your teeth and gums. Regular physical activity. Eating a healthy diet. Avoiding tobacco and drug use. Limiting alcohol use. Practicing safe sex. Taking low doses of aspirin every day. Taking vitamin and mineral supplements as recommended by your health care provider. What happens during an annual well check? The services and screenings done by your health care provider during your annual well check will depend on your age, overall health, lifestyle risk factors, and family history of disease. Counseling  Your health care provider may ask you questions about your: Alcohol use. Tobacco use. Drug use. Emotional well-being. Home and relationship  well-being. Sexual activity. Eating habits. History of falls. Memory and ability to understand (cognition). Work and work Statistician. Screening  You may have the following tests or measurements: Height, weight, and BMI. Blood pressure. Lipid and cholesterol levels. These may be checked every 5 years, or more frequently if you are over 8 years old. Skin check. Lung cancer screening. You may have this screening every year starting at age 27 if you have a 30-pack-year history of smoking and currently smoke or have quit within the past 15 years. Fecal occult blood test (FOBT) of the stool. You may have this test every year starting at age 34. Flexible sigmoidoscopy or colonoscopy. You may have a sigmoidoscopy every 5 years or a colonoscopy every 10 years starting at age 54. Prostate cancer screening. Recommendations will vary depending on your family history and other risks. Hepatitis C blood test. Hepatitis B blood test. Sexually transmitted disease (STD) testing. Diabetes screening. This is done by checking your blood sugar (glucose) after you have not eaten for a while (fasting). You may have this done every 1-3 years. Abdominal aortic aneurysm (AAA) screening. You may need this if you are a current or former smoker. Osteoporosis. You may be screened starting at age 77 if you are at high risk. Talk with your health care provider about your test results, treatment options, and if necessary, the need for more tests. Vaccines  Your health care provider may recommend certain vaccines, such as: Influenza vaccine. This is recommended every year. Tetanus, diphtheria, and acellular pertussis (Tdap, Td) vaccine. You may need a Td booster every 10 years. Zoster vaccine. You may need this after age 65. Pneumococcal 13-valent conjugate (PCV13) vaccine. One dose is recommended after age 56. Pneumococcal polysaccharide (PPSV23) vaccine. One dose is recommended  after age 92. Talk to your health care  provider about which screenings and vaccines you need and how often you need them. This information is not intended to replace advice given to you by your health care provider. Make sure you discuss any questions you have with your health care provider. Document Released: 07/09/2015 Document Revised: 03/01/2016 Document Reviewed: 04/13/2015 Elsevier Interactive Patient Education  2017 [place] Prevention in the Home Falls can cause injuries. They can happen to people of all ages. There are many things you can do to make your home safe and to help prevent falls. What can I do on the outside of my home? Regularly fix the edges of walkways and driveways and fix any cracks. Remove anything that might make you trip as you walk through a door, such as a raised step or threshold. Trim any bushes or trees on the path to your home. Use bright outdoor lighting. Clear any walking paths of anything that might make someone trip, such as rocks or tools. Regularly check to see if handrails are loose or broken. Make sure that both sides of any steps have handrails. Any raised decks and porches should have guardrails on the edges. Have any leaves, snow, or ice cleared regularly. Use sand or salt on walking paths during winter. Clean up any spills in your garage right away. This includes oil or grease spills. What can I do in the bathroom? Use night lights. Install grab bars by the toilet and in the tub and shower. Do not use towel bars as grab bars. Use non-skid mats or decals in the tub or shower. If you need to sit down in the shower, use a plastic, non-slip stool. Keep the floor dry. Clean up any water that spills on the floor as soon as it happens. Remove soap buildup in the tub or shower regularly. Attach bath mats securely with double-sided non-slip rug tape. Do not have throw rugs and other things on the floor that can make you trip. What can I do in the bedroom? Use night lights. Make  sure that you have a light by your bed that is easy to reach. Do not use any sheets or blankets that are too big for your bed. They should not hang down onto the floor. Have a firm chair that has side arms. You can use this for support while you get dressed. Do not have throw rugs and other things on the floor that can make you trip. What can I do in the kitchen? Clean up any spills right away. Avoid walking on wet floors. Keep items that you use a lot in easy-to-reach places. If you need to reach something above you, use a strong step stool that has a grab bar. Keep electrical cords out of the way. Do not use floor polish or wax that makes floors slippery. If you must use wax, use non-skid floor wax. Do not have throw rugs and other things on the floor that can make you trip. What can I do with my stairs? Do not leave any items on the stairs. Make sure that there are handrails on both sides of the stairs and use them. Fix handrails that are broken or loose. Make sure that handrails are as long as the stairways. Check any carpeting to make sure that it is firmly attached to the stairs. Fix any carpet that is loose or worn. Avoid having throw rugs at the top or bottom of the stairs. If you  do have throw rugs, attach them to the floor with carpet tape. Make sure that you have a light switch at the top of the stairs and the bottom of the stairs. If you do not have them, ask someone to add them for you. What else can I do to help prevent falls? Wear shoes that: Do not have high heels. Have rubber bottoms. Are comfortable and fit you well. Are closed at the toe. Do not wear sandals. If you use a stepladder: Make sure that it is fully opened. Do not climb a closed stepladder. Make sure that both sides of the stepladder are locked into place. Ask someone to hold it for you, if possible. Clearly mark and make sure that you can see: Any grab bars or handrails. First and last steps. Where the  edge of each step is. Use tools that help you move around (mobility aids) if they are needed. These include: Canes. Walkers. Scooters. Crutches. Turn on the lights when you go into a dark area. Replace any light bulbs as soon as they burn out. Set up your furniture so you have a clear path. Avoid moving your furniture around. If any of your floors are uneven, fix them. If there are any pets around you, be aware of where they are. Review your medicines with your doctor. Some medicines can make you feel dizzy. This can increase your chance of falling. Ask your doctor what other things that you can do to help prevent falls. This information is not intended to replace advice given to you by your health care provider. Make sure you discuss any questions you have with your health care provider. Document Released: 04/08/2009 Document Revised: 11/18/2015 Document Reviewed: 07/17/2014 Elsevier Interactive Patient Education  2017 [employer].

## 2022-07-05 IMAGING — CR DG ABDOMEN 1V
1 series · 2 of 2 positions shown · non-contrast
Comparison: CT abdomen pelvis dated 04/06/2020.

CLINICAL DATA: Kidney stones.

EXAM:
ABDOMEN - 1 VIEW

[Series 1: dg abd 1 view · 0.14mm/px · 2 of 2 slices shown]
[im 1/2]
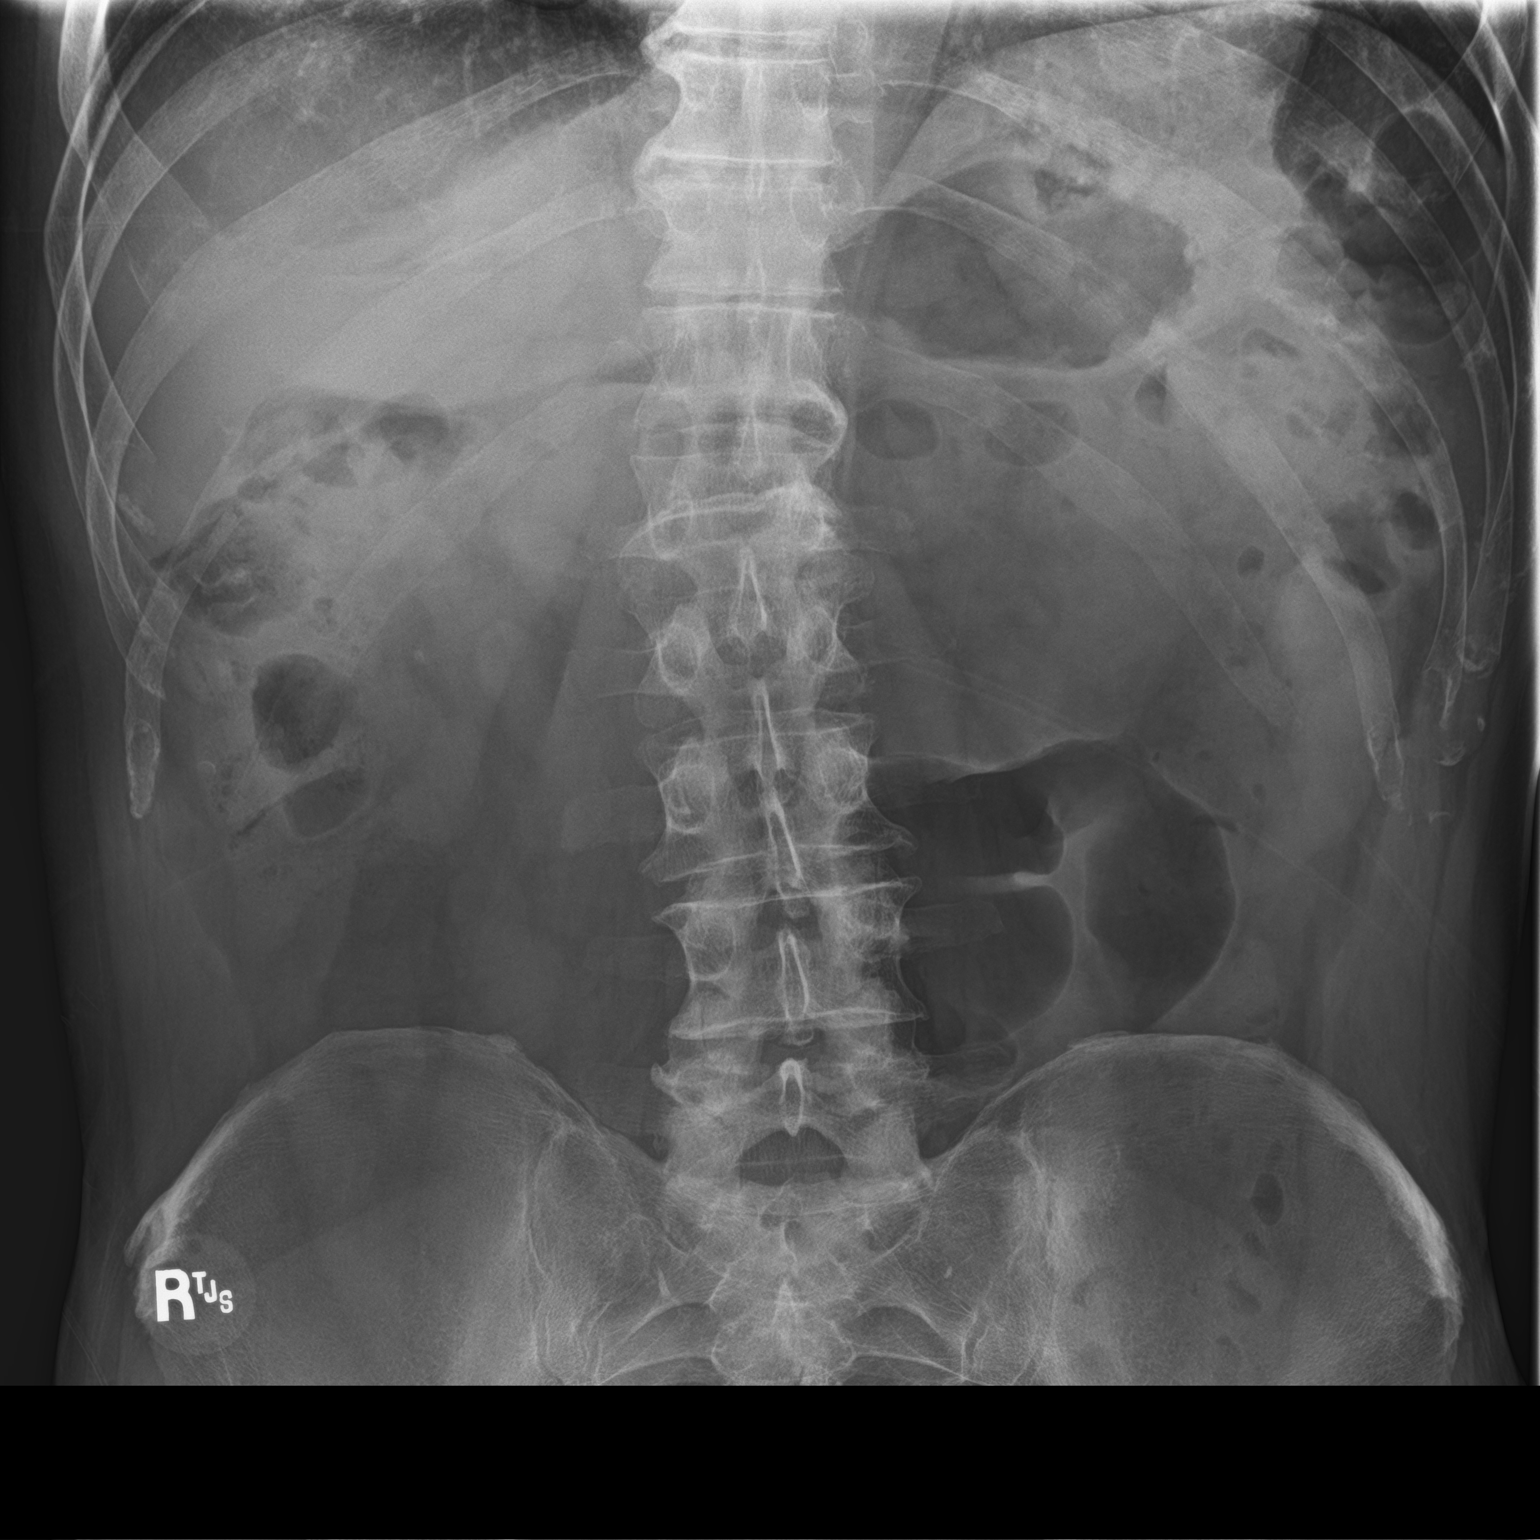
[im 2/2]
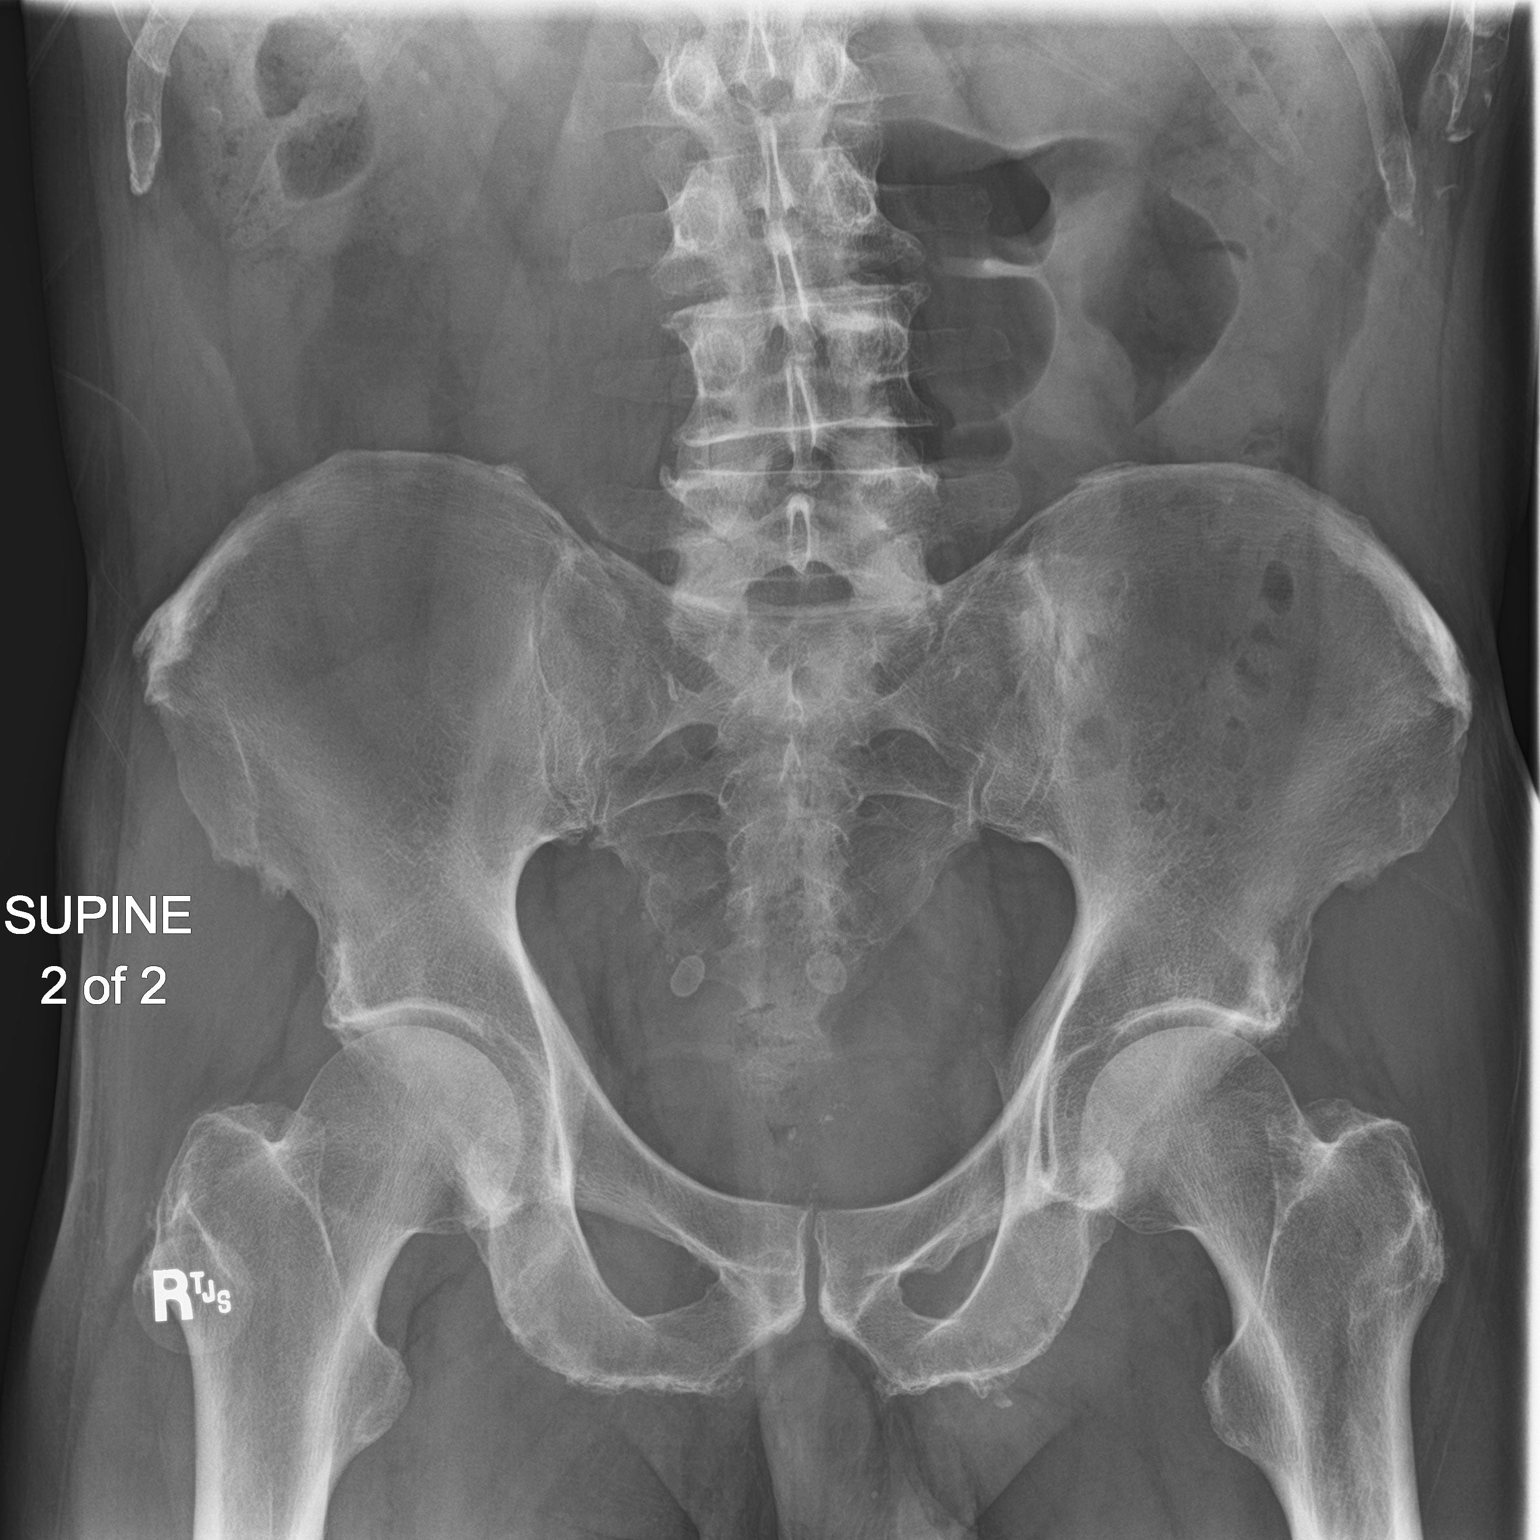

[2 of 2 positions shown; findings below may reference images not displayed]

FINDINGS: There is a 3 mm radiopaque focus over the inferior aspect the right
renal silhouette likely corresponding to the stone seen on the prior
CT. No other radiopaque calculi identified over the renal
silhouettes or expected course of the ureters. Two bladder calculi
measure up to 12 mm. Small prostate calcifications noted.

No bowel dilatation or evidence of obstruction. No free air. The
osseous structures are intact.
IMPRESSION: 1. A 3 mm right renal calculus.
2. Two bladder calculi measure up to 12 mm.

## 2022-10-04 ENCOUNTER — Ambulatory Visit (INDEPENDENT_AMBULATORY_CARE_PROVIDER_SITE_OTHER): Payer: Medicare Other | Admitting: Physician Assistant

## 2022-10-04 ENCOUNTER — Encounter: Payer: Self-pay | Admitting: Physician Assistant

## 2022-10-04 VITALS — BP 124/79 | HR 62 | Ht 71.0 in | Wt 186.0 lb

## 2022-10-04 DIAGNOSIS — R7989 Other specified abnormal findings of blood chemistry: Secondary | ICD-10-CM

## 2022-10-04 DIAGNOSIS — R7309 Other abnormal glucose: Secondary | ICD-10-CM | POA: Diagnosis not present

## 2022-10-04 DIAGNOSIS — N138 Other obstructive and reflux uropathy: Secondary | ICD-10-CM

## 2022-10-04 DIAGNOSIS — I10 Essential (primary) hypertension: Secondary | ICD-10-CM

## 2022-10-04 DIAGNOSIS — E7800 Pure hypercholesterolemia, unspecified: Secondary | ICD-10-CM

## 2022-10-04 DIAGNOSIS — N401 Enlarged prostate with lower urinary tract symptoms: Secondary | ICD-10-CM

## 2022-10-04 DIAGNOSIS — Z0000 Encounter for general adult medical examination without abnormal findings: Secondary | ICD-10-CM | POA: Diagnosis not present

## 2022-10-04 NOTE — Progress Notes (Unsigned)
Complete physical exam   Patient: Wayne Holland   DOB: 21-Nov-1955   67 y.o. Male  MRN: [id number] Visit Date: 10/04/2022  Today's healthcare provider: Debera Lat, PA-C   Chief Complaint  Patient presents with   Annual Exam   Subjective    Wayne Holland is a 67 y.o. male who presents today for a complete physical exam.  He reports consuming a  healthy  diet.     He generally feels fairly well. He reports sleeping fairly well. He does not have additional problems to discuss today.  HPI  Saw an ophthalmology 18 mo ago  Past Medical History:  Diagnosis Date   History of kidney stones    Hypertension    Pre-diabetes    Sleep apnea    Past Surgical History:  Procedure Laterality Date   COLONOSCOPY  2007   COLONOSCOPY WITH PROPOFOL N/A 06/28/2016   Procedure: COLONOSCOPY WITH PROPOFOL;  Surgeon: Earline Mayotte, MD;  Location: [facility];  Service: Endoscopy;  Laterality: N/A;   CYSTOSCOPY WITH LITHOLAPAXY N/A 04/08/2021   Procedure: CYSTOSCOPY WITH LITHOLAPAXY;  Surgeon: Sondra Come, MD;  Location: [facility] ORS;  Service: Urology;  Laterality: N/A;   HOLEP-LASER ENUCLEATION OF THE PROSTATE WITH MORCELLATION N/A 04/08/2021   Procedure: HOLEP-LASER ENUCLEATION OF THE PROSTATE WITH MORCELLATION;  Surgeon: Sondra Come, MD;  Location: [facility] ORS;  Service: Urology;  Laterality: N/A;   NO PAST SURGERIES     Social History   Socioeconomic History   Marital status: Married    Spouse name: Rinaldo Cloud   Number of children: Not on file   Years of education: Not on file   Highest education level: Not on file  Occupational History   Not on file  Tobacco Use   Smoking status: Former    Years: 4    Types: Cigarettes   Smokeless tobacco: Never   Tobacco comments:    QUIT IN 1970'S  Substance and Sexual Activity   Alcohol use: Yes    Alcohol/week: 0.0 standard drinks of alcohol    Comment: OCCASIONALLY   Drug use: No   Sexual activity: Yes    Birth  control/protection: None  Other Topics Concern   Not on file  Social History Narrative   Not on file   Social Determinants of Health   Financial Resource Strain: Low Risk  (01/03/2022)   Overall Financial Resource Strain (CARDIA)    Difficulty of Paying Living Expenses: Not hard at all  Food Insecurity: No Food Insecurity (01/03/2022)   Hunger Vital Sign    Worried About Running Out of Food in the Last Year: Never true    Ran Out of Food in the Last Year: Never true  Transportation Needs: No Transportation Needs (01/03/2022)   PRAPARE - Administrator, Civil Service (Medical): No    Lack of Transportation (Non-Medical): No  Physical Activity: Sufficiently Active (01/03/2022)   Exercise Vital Sign    Days of Exercise per Week: 4 days    Minutes of Exercise per Session: 50 min  Stress: No Stress Concern Present (01/03/2022)   [employer] of Occupational Health - Occupational Stress Questionnaire    Feeling of Stress : Not at all  Social Connections: Moderately Integrated (01/03/2022)   Social Connection and Isolation Panel [NHANES]    Frequency of Communication with Friends and Family: More than three times a week    Frequency of Social Gatherings with Friends and Family: Twice a week  Attends Religious Services: 1 to 4 times per year    Active Member of Clubs or Organizations: No    Attends Banker Meetings: Never    Marital Status: Married  Catering manager Violence: Not At Risk (01/03/2022)   Humiliation, Afraid, Rape, and Kick questionnaire    Fear of Current or Ex-Partner: No    Emotionally Abused: No    Physically Abused: No    Sexually Abused: No   Family Status  Relation Name Status   Mother  Deceased at age 66   Father  Deceased at age 73   Neg Hx  (Not Specified)   Family History  Problem Relation Age of Onset   Dementia Mother    Ovarian cancer Mother    Stroke Father    Diabetes Father    Colon cancer Neg Hx    No Known  Allergies  Patient Care Team: Debera Lat, PA-C as PCP - General (Physician Assistant) Chrismon, Jodell Cipro, PA-C (Inactive) (Family Medicine) Earline Mayotte, MD (General Surgery)   Medications: Outpatient Medications Prior to Visit  Medication Sig   lisinopril (ZESTRIL) 5 MG tablet Take 1 tablet (5 mg total) by mouth daily.   pravastatin (PRAVACHOL) 20 MG tablet Take 1 tablet (20 mg total) by mouth daily.   No facility-administered medications prior to visit.    Review of Systems  All other systems reviewed and are negative. Except see HPI   {Labs  Heme  Chem  Endocrine  Serology  Results Review (optional):23779}  Objective    BP 124/79 (BP Location: Right Arm, Patient Position: Sitting, Cuff Size: Normal)   Pulse 62   Ht 5\' 11"  (1.803 m)   Wt 186 lb (84.4 kg)   SpO2 96%   BMI 25.94 kg/m  {Show previous vital signs (optional):23777}   Physical Exam Vitals reviewed.  Constitutional:      General: He is not in acute distress.    Appearance: Normal appearance. He is well-developed. He is not diaphoretic.  HENT:     Head: Normocephalic and atraumatic.     Right Ear: Tympanic membrane, ear canal and external ear normal. There is no impacted cerumen.     Left Ear: Tympanic membrane, ear canal and external ear normal. There is no impacted cerumen.     Nose: Congestion and rhinorrhea present.     Mouth/Throat:     Mouth: Mucous membranes are moist.     Pharynx: Oropharynx is clear. No oropharyngeal exudate or posterior oropharyngeal erythema.  Eyes:     General: No scleral icterus.       Right eye: No discharge.        Left eye: No discharge.     Extraocular Movements: Extraocular movements intact.     Conjunctiva/sclera: Conjunctivae normal.     Pupils: Pupils are equal, round, and reactive to light.  Neck:     Thyroid: No thyromegaly.     Vascular: No carotid bruit.  Cardiovascular:     Rate and Rhythm: Normal rate and regular rhythm.     Pulses: Normal  pulses.     Heart sounds: Normal heart sounds. No murmur heard. Pulmonary:     Effort: Pulmonary effort is normal. No respiratory distress.     Breath sounds: Normal breath sounds. No stridor. No wheezing, rhonchi or rales.  Chest:     Chest wall: No tenderness.  Abdominal:     General: There is no distension.     Palpations: Abdomen is soft. There  is no mass.     Tenderness: There is no abdominal tenderness. There is no right CVA tenderness, left CVA tenderness, guarding or rebound.     Hernia: No hernia is present.  Musculoskeletal:        General: No swelling, tenderness or deformity. Normal range of motion.     Cervical back: Normal range of motion and neck supple. No rigidity or tenderness.     Right lower leg: No edema.     Left lower leg: No edema.  Lymphadenopathy:     Cervical: No cervical adenopathy.  Skin:    General: Skin is warm and dry.     Findings: Lesion (hyperpigmented patch after removal with liquid nitrogen, above the left medical malleolus,  managed by Dermatology) present. No rash.     Comments: Thick, raised, discolored left great toe due to multiple trauma of great toe/nail in the past including nail removal  Neurological:     Mental Status: He is alert and oriented to person, place, and time. Mental status is at baseline.     Cranial Nerves: No cranial nerve deficit.     Sensory: No sensory deficit.     Motor: No weakness.     Coordination: Coordination normal.     Gait: Gait normal.     Deep Tendon Reflexes: Reflexes normal.  Psychiatric:        Mood and Affect: Mood normal.        Behavior: Behavior normal.        Thought Content: Thought content normal.        Judgment: Judgment normal.      Last depression screening scores    10/04/2022    9:57 AM 01/03/2022    8:15 AM 12/13/2021    8:47 AM  PHQ 2/9 Scores  PHQ - 2 Score 0 0 0  PHQ- 9 Score 0 0 0   Last fall risk screening    10/04/2022    9:57 AM  Fall Risk   Falls in the past year? 0   Number falls in past yr: 0  Injury with Fall? 0   Last Audit-C alcohol use screening    10/04/2022    9:55 AM  Alcohol Use Disorder Test (AUDIT)  1. How often do you have a drink containing alcohol? 1  2. How many drinks containing alcohol do you have on a typical day when you are drinking? 0  3. How often do you have six or more drinks on one occasion? 0  AUDIT-C Score 1   A score of 3 or more in women, and 4 or more in men indicates increased risk for alcohol abuse, EXCEPT if all of the points are from question 1   No results found for any visits on 10/04/22.  Assessment & Plan    Routine Health Maintenance and Physical Exam  Exercise Activities and Dietary recommendations  Goals      DIET - EAT MORE FRUITS AND VEGETABLES        Immunization History  Administered Date(s) Administered   Influenza Split 04/09/2012   Influenza, High Dose Seasonal PF 05/08/2021   Influenza,inj,Quad PF,6+ Mos 04/17/2013, 04/24/2014, 04/26/2015, 05/04/2016, 05/10/2017, 05/13/2018, 03/27/2019, 05/11/2020   Influenza-Unspecified 03/27/2019   PFIZER Comirnaty(Gray Top)Covid-19 Tri-Sucrose Vaccine 11/15/2020   PFIZER(Purple Top)SARS-COV-2 Vaccination 08/31/2019, 09/22/2019, 05/19/2020   Tdap 04/07/2011   Zoster, Live 05/04/2016    Health Maintenance  Topic Date Due   Pneumonia Vaccine 4165+ Years old (1 of 2 - PCV) Never  done   Zoster Vaccines- Shingrix (1 of 2) Never done   DTaP/Tdap/Td (2 - Td or Tdap) 04/06/2021   COVID-19 Vaccine (5 - 2023-24 season) 02/24/2022   Medicare Annual Wellness (AWV)  01/04/2023   INFLUENZA VACCINE  01/25/2023   COLONOSCOPY (Pts 45-57yrs Insurance coverage will need to be confirmed)  06/28/2026   Hepatitis C Screening  Completed   HPV VACCINES  Aged Out    Discussed health benefits of physical activity, and encouraged him to engage in regular exercise appropriate for his age and condition. 1. Primary hypertension Chronic and stable BP WNL, at  goal Continue  - Lipid panel - Hemoglobin A1c - TSH - T4, free - Comprehensive metabolic panel Will reassess after  receiving lab results  2. Pure hypercholesterolemia Chronic and previously stable - Lipid panel - Hemoglobin A1c - TSH - T4, free - Comprehensive metabolic panel Will reassess after  receiving lab results   3. Low TSH level Chronic , asymptomatic - Lipid panel - Hemoglobin A1c - TSH - T4, free - Comprehensive metabolic panel Will reassess after  receiving lab results  4. Elevated glucose Chronic and stable - Lipid panel - Hemoglobin A1c - TSH - T4, free - Comprehensive metabolic panel Could be due to IBS/abd pain associated with altered defecation/bowel habits. Pain relieved with defecation - POCT urinalysis dipstick Low fat, high fiber and unprocessed food diet continue Advised to Avoid beverages containing sorbitol or fructose, avoid gas-producing foods. Recommended a good-quality Sleep and daily exercise.  5. Annual Physical UTD on dental/eye Things to do to keep yourself healthy  - Exercise at least 30-45 minutes a day, 3-4 days a week.  - Eat a low-fat diet with lots of fruits and vegetables, up to 7-9 servings per day.  - Seatbelts can save your life. Wear them always.  - Smoke detectors on every level of your home, check batteries every year.  - Eye Doctor - have an eye exam every 1-2 years  - Safe sex - if you may be exposed to STDs, use a condom.  - Alcohol -  If you drink, do it moderately, less than 2 drinks per day.  - Health Care Power of Attorney. Choose someone to speak for you if you are not able.  - Depression is common in our stressful world.If you're feeling down or losing interest in things you normally enjoy, please come in for a visit.  - Violence - If anyone is threatening or hurting you, please call immediately.   No follow-ups on file.     The patient was advised to call back or seek an in-person evaluation if the  symptoms worsen or if the condition fails to improve as anticipated.  I discussed the assessment and treatment plan with the patient. The patient was provided an opportunity to ask questions and all were answered. The patient agreed with the plan and demonstrated an understanding of the instructions.  I, Debera Lat, PA-C have reviewed all documentation for this visit. The documentation on  10/14/22 for the exam, diagnosis, procedures, and orders are all accurate and complete.  Debera Lat, [facility], MMS [facility] [phone] (phone) [phone] (fax)  [facility]

## 2022-10-05 DIAGNOSIS — I10 Essential (primary) hypertension: Secondary | ICD-10-CM | POA: Insufficient documentation

## 2022-10-05 LAB — COMPREHENSIVE METABOLIC PANEL
ALT: 37 IU/L (ref 0–44)
AST: 26 IU/L (ref 0–40)
Albumin/Globulin Ratio: 2 (ref 1.2–2.2)
Albumin: 4.6 g/dL (ref 3.9–4.9)
Alkaline Phosphatase: 79 IU/L (ref 44–121)
BUN/Creatinine Ratio: 14 (ref 10–24)
BUN: 13 mg/dL (ref 8–27)
Bilirubin Total: 0.9 mg/dL (ref 0.0–1.2)
CO2: 23 mmol/L (ref 20–29)
Calcium: 9.5 mg/dL (ref 8.6–10.2)
Chloride: 103 mmol/L (ref 96–106)
Creatinine, Ser: 0.92 mg/dL (ref 0.76–1.27)
Globulin, Total: 2.3 g/dL (ref 1.5–4.5)
Glucose: 108 mg/dL — ABNORMAL HIGH (ref 70–99)
Potassium: 4.8 mmol/L (ref 3.5–5.2)
Sodium: 139 mmol/L (ref 134–144)
Total Protein: 6.9 g/dL (ref 6.0–8.5)
eGFR: 92 mL/min/{1.73_m2} (ref >59)

## 2022-10-05 LAB — HEMOGLOBIN A1C
Est. average glucose Bld gHb Est-mCnc: 128 mg/dL
Hgb A1c MFr Bld: 6.1 % — ABNORMAL HIGH (ref 4.8–5.6)

## 2022-10-05 LAB — LIPID PANEL
Chol/HDL Ratio: 3.2 ratio (ref 0.0–5.0)
Cholesterol, Total: 185 mg/dL (ref 100–199)
HDL: 57 mg/dL (ref >39)
LDL Chol Calc (NIH): 113 mg/dL — ABNORMAL HIGH (ref 0–99)
Triglycerides: 80 mg/dL (ref 0–149)
VLDL Cholesterol Cal: 15 mg/dL (ref 5–40)

## 2022-10-05 LAB — TSH: TSH: 0.357 u[IU]/mL — ABNORMAL LOW (ref 0.450–4.500)

## 2022-10-05 LAB — T4, FREE: Free T4: 1.4 ng/dL (ref 0.82–1.77)

## 2022-10-05 NOTE — Progress Notes (Signed)
Please, let pt know that his lab results were negative except A1C was 6.1 vs 5.8 9 mo ago.  His cholesterol still elevated. The 10-year ASCVD risk score (heart attack and stroke) is: 17.9% Please, increase pravastatin to 40 mg. Please, schedule a fu in 3 mo. When we change a dose of medication, we need to see pt in 6 weeks or in 3 mo. His TSH is low but better than 9 mo ago. He is asymptomatic , will recheck at your next appt. Advised low carb diet and regular exercise to lose some weight.

## 2022-10-11 DIAGNOSIS — H5203 Hypermetropia, bilateral: Secondary | ICD-10-CM | POA: Diagnosis not present

## 2022-10-11 DIAGNOSIS — H2513 Age-related nuclear cataract, bilateral: Secondary | ICD-10-CM | POA: Diagnosis not present

## 2022-10-11 DIAGNOSIS — H52223 Regular astigmatism, bilateral: Secondary | ICD-10-CM | POA: Diagnosis not present

## 2022-10-11 DIAGNOSIS — H40023 Open angle with borderline findings, high risk, bilateral: Secondary | ICD-10-CM | POA: Diagnosis not present

## 2022-11-10 ENCOUNTER — Other Ambulatory Visit: Payer: Self-pay | Admitting: Physician Assistant

## 2022-11-10 DIAGNOSIS — E7800 Pure hypercholesterolemia, unspecified: Secondary | ICD-10-CM

## 2022-11-10 NOTE — Telephone Encounter (Signed)
Requested Prescriptions  Pending Prescriptions Disp Refills   pravastatin (PRAVACHOL) 20 MG tablet [Pharmacy Med Name: PRAVASTATIN SODIUM 20 MG TAB] 90 tablet 0    Sig: TAKE 1 TABLET BY MOUTH EVERY DAY     Cardiovascular:  Antilipid - Statins Failed - 11/10/2022 12:21 PM      Failed - Lipid Panel in normal range within the last 12 months    Cholesterol, Total  Date Value Ref Range Status  10/04/2022 185 100 - 199 mg/dL Final   LDL Cholesterol (Calc)  Date Value Ref Range Status  05/10/2017 89 mg/dL (calc) Final    Comment:    Reference range: <100 . Desirable range <100 mg/dL for primary prevention;   <70 mg/dL for patients with CHD or diabetic patients  with > or = 2 CHD risk factors. Marland Kitchen LDL-C is now calculated using the Martin-Hopkins  calculation, which is a validated novel method providing  better accuracy than the Friedewald equation in the  estimation of LDL-C.  Horald Pollen et al. Lenox Ahr. 2440;102(72): 2061-2068  (http://education.[facility].com/faq/FAQ164)    LDL Chol Calc (NIH)  Date Value Ref Range Status  10/04/2022 113 (H) 0 - 99 mg/dL Final   HDL  Date Value Ref Range Status  10/04/2022 57 >39 mg/dL Final   Triglycerides  Date Value Ref Range Status  10/04/2022 80 0 - 149 mg/dL Final         Passed - Patient is not pregnant      Passed - Valid encounter within last 12 months    Recent Outpatient Visits           1 month ago Annual physical exam   [place] [facility] [place], [place], PA-C   11 months ago Primary hypertension   [place] [facility] Tajique, [place], PA-C   1 year ago Elevated glucose   [place] [facility] Chrismon, Jodell Cipro, [place]   2 years ago Essential hypertension   [place] [facility] Just, Azalee Course, FNP   2 years ago Pain of great toe, right   [place] [facility] Chrismon, Jodell Cipro, PA-C       Future Appointments             In 1  month Ostwalt, Edmon Crape, PA-C  [facility]   In 4 months Ostwalt, [place], PA-C [employer], [facility]

## 2022-11-14 ENCOUNTER — Encounter: Payer: Self-pay | Admitting: Physician Assistant

## 2022-11-14 ENCOUNTER — Other Ambulatory Visit: Payer: Self-pay

## 2022-11-14 DIAGNOSIS — E7800 Pure hypercholesterolemia, unspecified: Secondary | ICD-10-CM

## 2022-11-14 MED ORDER — PRAVASTATIN SODIUM 40 MG PO TABS
40.0000 mg | ORAL_TABLET | Freq: Every day | ORAL | 1 refills | Status: DC
Start: 2022-11-14 — End: 2023-04-06

## 2022-12-01 DIAGNOSIS — D225 Melanocytic nevi of trunk: Secondary | ICD-10-CM | POA: Diagnosis not present

## 2022-12-01 DIAGNOSIS — D2262 Melanocytic nevi of left upper limb, including shoulder: Secondary | ICD-10-CM | POA: Diagnosis not present

## 2022-12-01 DIAGNOSIS — L570 Actinic keratosis: Secondary | ICD-10-CM | POA: Diagnosis not present

## 2022-12-01 DIAGNOSIS — D2261 Melanocytic nevi of right upper limb, including shoulder: Secondary | ICD-10-CM | POA: Diagnosis not present

## 2022-12-01 DIAGNOSIS — X32XXXA Exposure to sunlight, initial encounter: Secondary | ICD-10-CM | POA: Diagnosis not present

## 2022-12-01 DIAGNOSIS — D2272 Melanocytic nevi of left lower limb, including hip: Secondary | ICD-10-CM | POA: Diagnosis not present

## 2022-12-07 ENCOUNTER — Other Ambulatory Visit: Payer: Self-pay | Admitting: Physician Assistant

## 2022-12-07 DIAGNOSIS — I10 Essential (primary) hypertension: Secondary | ICD-10-CM

## 2022-12-07 NOTE — Telephone Encounter (Signed)
Requested Prescriptions  Pending Prescriptions Disp Refills   lisinopril (ZESTRIL) 5 MG tablet [Pharmacy Med Name: LISINOPRIL 5 MG TABLET] 90 tablet 0    Sig: TAKE 1 TABLET (5 MG TOTAL) BY MOUTH DAILY.     Cardiovascular:  ACE Inhibitors Passed - 12/07/2022  2:55 AM      Passed - Cr in normal range and within 180 days    Creat  Date Value Ref Range Status  05/10/2017 0.91 0.70 - 1.25 mg/dL Final    Comment:    For patients >67 years of age, the reference limit for Creatinine is approximately 13% higher for people identified as African-American. .    Creatinine, Ser  Date Value Ref Range Status  10/04/2022 0.92 0.76 - 1.27 mg/dL Final         Passed - K in normal range and within 180 days    Potassium  Date Value Ref Range Status  10/04/2022 4.8 3.5 - 5.2 mmol/L Final         Passed - Patient is not pregnant      Passed - Last BP in normal range    BP Readings from Last 1 Encounters:  10/04/22 124/79         Passed - Valid encounter within last 6 months    Recent Outpatient Visits           2 months ago Annual physical exam   [facility] Golden, [place], PA-C   11 months ago Primary hypertension   [place] [facility], [place], PA-C   1 year ago Elevated glucose   [facility] Chrismon, Jodell Cipro, [place]   2 years ago Essential hypertension   [place] [facility] Just, Azalee Course, FNP   2 years ago Pain of great toe, right   [place] [facility] Chrismon, Jodell Cipro, PA-C       Future Appointments             In 4 weeks Ostwalt, Edmon Crape, PA-C [place] [facility]   In 4 months Ostwalt, [place], PA-C [facility]

## 2022-12-11 ENCOUNTER — Other Ambulatory Visit: Payer: Self-pay

## 2022-12-11 DIAGNOSIS — N138 Other obstructive and reflux uropathy: Secondary | ICD-10-CM

## 2022-12-12 ENCOUNTER — Other Ambulatory Visit
Admission: RE | Admit: 2022-12-12 | Discharge: 2022-12-12 | Disposition: A | Discharge status: Home / Self Care | Payer: Medicare Other | Attending: Urology | Admitting: Urology

## 2022-12-12 ENCOUNTER — Ambulatory Visit: Payer: Medicare Other | Admitting: Urology

## 2022-12-12 ENCOUNTER — Encounter: Payer: Self-pay | Admitting: Urology

## 2022-12-12 VITALS — BP 142/79 | HR 65 | Ht 71.0 in | Wt 175.0 lb

## 2022-12-12 DIAGNOSIS — N401 Enlarged prostate with lower urinary tract symptoms: Secondary | ICD-10-CM | POA: Diagnosis not present

## 2022-12-12 DIAGNOSIS — N138 Other obstructive and reflux uropathy: Secondary | ICD-10-CM | POA: Diagnosis not present

## 2022-12-12 DIAGNOSIS — Z125 Encounter for screening for malignant neoplasm of prostate: Secondary | ICD-10-CM

## 2022-12-12 LAB — BLADDER SCAN AMB NON-IMAGING

## 2022-12-12 LAB — PSA: Prostatic Specific Antigen: 0.23 ng/mL (ref 0.00–4.00)

## 2022-12-12 NOTE — Progress Notes (Signed)
   12/12/2022 9:20 AM   Wayne Holland 1955/06/30 [id number]  Reason for visit: Follow up BPH and bladder stones status post HOLEP, PSA screening  HPI: 67 year old male with BPH, recurrent gross hematuria, urgency, frequency, weak stream, and multiple bladder stones who underwent an uncomplicated cystolitholapaxy+ HOLEP on 04/08/2021 with removal of 61 g of benign tissue.  He had significant bladder trabeculations and diverticula at time of cystoscopy.  He is urinating with a strong stream and overall doing extremely well.  He denies any incontinence or hematuria.  PVR is normal today at 0 mL  No recent PSA values, was last checked in September 2021 was normal at 1.7.  We reviewed the risks and benefits of PSA screening and with his excellent health, I recommended PSA today.  This can be checked every 2 to 4 years through age 64-75 with his good health.  Anticipate low PSA with history of HOLEP.  PSA today, call with results, if normal can follow-up with urology as needed  Sondra Come, MD  [facility] [address], [address] [place], [place] [address] [phone]

## 2023-01-04 NOTE — Progress Notes (Deleted)
  Established patient visit  Patient: Wayne Holland   DOB: Sep 12, 1955   67 y.o. Male  MRN: [id number] Visit Date: 01/05/2023  Today's healthcare provider: Debera Lat, PA-C   No chief complaint on file.  Subjective    HPI  *** Discussed the use of AI scribe software for clinical note transcription with the patient, who gave verbal consent to proceed.  History of Present Illness               10/04/2022    9:57 AM 01/03/2022    8:15 AM 12/13/2021    8:47 AM  Depression screen PHQ 2/9  Decreased Interest 0 0 0  Down, Depressed, Hopeless 0 0 0  PHQ - 2 Score 0 0 0  Altered sleeping 0 0 0  Tired, decreased energy 0 0 0  Change in appetite 0 0 0  Feeling bad or failure about yourself  0 0 0  Trouble concentrating 0 0 0  Moving slowly or fidgety/restless 0 0 0  Suicidal thoughts 0 0 0  PHQ-9 Score 0 0 0  Difficult doing work/chores Not difficult at all Not difficult at all Not difficult at all       No data to display          Medications: Outpatient Medications Prior to Visit  Medication Sig   lisinopril (ZESTRIL) 5 MG tablet TAKE 1 TABLET (5 MG TOTAL) BY MOUTH DAILY.   pravastatin (PRAVACHOL) 40 MG tablet Take 1 tablet (40 mg total) by mouth daily.   No facility-administered medications prior to visit.    Review of Systems Except see HPI   {Labs  Heme  Chem  Endocrine  Serology  Results Review (optional):23779}   Objective    There were no vitals taken for this visit. {Show previous vital signs (optional):23777}  Physical Exam   No results found for any visits on 01/05/23.  Assessment & Plan    *** Assessment and Plan              No follow-ups on file.     The patient was advised to call back or seek an in-person evaluation if the symptoms worsen or if the condition fails to improve as anticipated.  I discussed the assessment and treatment plan with the patient. The patient was provided an opportunity to ask questions and all  were answered. The patient agreed with the plan and demonstrated an understanding of the instructions.  I, Debera Lat, PA-C have reviewed all documentation for this visit. The documentation on  01/04/23 for the exam, diagnosis, procedures, and orders are all accurate and complete.  Debera Lat, [facility], MMS [facility] [phone] (phone) [phone] (fax)  [facility]

## 2023-01-05 ENCOUNTER — Other Ambulatory Visit: Payer: Self-pay

## 2023-01-05 ENCOUNTER — Ambulatory Visit: Payer: Medicare Other | Admitting: Physician Assistant

## 2023-01-05 DIAGNOSIS — E7800 Pure hypercholesterolemia, unspecified: Secondary | ICD-10-CM

## 2023-01-06 LAB — COMPREHENSIVE METABOLIC PANEL
ALT: 23 IU/L (ref 0–44)
AST: 22 IU/L (ref 0–40)
Albumin: 4.3 g/dL (ref 3.9–4.9)
Alkaline Phosphatase: 81 IU/L (ref 44–121)
BUN/Creatinine Ratio: 11 (ref 10–24)
BUN: 10 mg/dL (ref 8–27)
Bilirubin Total: 0.7 mg/dL (ref 0.0–1.2)
CO2: 24 mmol/L (ref 20–29)
Calcium: 9.5 mg/dL (ref 8.6–10.2)
Chloride: 105 mmol/L (ref 96–106)
Creatinine, Ser: 0.92 mg/dL (ref 0.76–1.27)
Globulin, Total: 2.2 g/dL (ref 1.5–4.5)
Glucose: 109 mg/dL — ABNORMAL HIGH (ref 70–99)
Potassium: 4.9 mmol/L (ref 3.5–5.2)
Sodium: 141 mmol/L (ref 134–144)
Total Protein: 6.5 g/dL (ref 6.0–8.5)
eGFR: 91 mL/min/{1.73_m2} (ref >59)

## 2023-01-06 LAB — LIPID PANEL WITH LDL/HDL RATIO
Cholesterol, Total: 146 mg/dL (ref 100–199)
HDL: 60 mg/dL (ref >39)
LDL Chol Calc (NIH): 73 mg/dL (ref 0–99)
LDL/HDL Ratio: 1.2 ratio (ref 0.0–3.6)
Triglycerides: 61 mg/dL (ref 0–149)
VLDL Cholesterol Cal: 13 mg/dL (ref 5–40)

## 2023-01-08 ENCOUNTER — Ambulatory Visit: Payer: Medicare Other

## 2023-01-08 VITALS — BP 118/70 | Ht 71.0 in | Wt 179.2 lb

## 2023-01-08 DIAGNOSIS — Z0000 Encounter for general adult medical examination without abnormal findings: Secondary | ICD-10-CM

## 2023-01-08 NOTE — Progress Notes (Signed)
Subjective:   Wayne Holland is a 67 y.o. male who presents for Medicare Annual/Subsequent preventive examination.  Visit Complete: In person  Patient Medicare AWV questionnaire was completed by the patient on 01/06/23; I have confirmed that all information answered by patient is correct and no changes since this date.  Review of Systems    Cardiac Risk Factors include: dyslipidemia;male gender    Objective:    Today's Vitals   01/08/23 0813  BP: 118/70  Weight: 179 lb 3.2 oz (81.3 kg)  Height: 5\' 11"  (1.803 m)   Body mass index is 24.99 kg/m.     01/08/2023    8:17 AM 01/03/2022    8:18 AM 04/08/2021    7:13 AM 04/05/2021   11:24 AM  Advanced Directives  Does Patient Have a Medical Advance Directive? No No No No  Would patient like information on creating a medical advance directive?  No - Patient declined No - Patient declined     Current Medications (verified) Outpatient Encounter Medications as of 01/08/2023  Medication Sig   lisinopril (ZESTRIL) 5 MG tablet TAKE 1 TABLET (5 MG TOTAL) BY MOUTH DAILY.   pravastatin (PRAVACHOL) 40 MG tablet Take 1 tablet (40 mg total) by mouth daily.   No facility-administered encounter medications on file as of 01/08/2023.    Allergies (verified) Patient has no known allergies.   History: Past Medical History:  Diagnosis Date   History of kidney stones    Hypertension    Pre-diabetes    Sleep apnea    Past Surgical History:  Procedure Laterality Date   COLONOSCOPY  2007   COLONOSCOPY WITH PROPOFOL N/A 06/28/2016   Procedure: COLONOSCOPY WITH PROPOFOL;  Surgeon: Earline Mayotte, MD;  Location: [facility] ENDOSCOPY;  Service: Endoscopy;  Laterality: N/A;   CYSTOSCOPY WITH LITHOLAPAXY N/A 04/08/2021   Procedure: CYSTOSCOPY WITH LITHOLAPAXY;  Surgeon: Sondra Come, MD;  Location: [facility] ORS;  Service: Urology;  Laterality: N/A;   HOLEP-LASER ENUCLEATION OF THE PROSTATE WITH MORCELLATION N/A 04/08/2021   Procedure:  HOLEP-LASER ENUCLEATION OF THE PROSTATE WITH MORCELLATION;  Surgeon: Sondra Come, MD;  Location: [facility] ORS;  Service: Urology;  Laterality: N/A;   NO PAST SURGERIES     Family History  Problem Relation Age of Onset   Dementia Mother    Ovarian cancer Mother    Stroke Father    Diabetes Father    Colon cancer Neg Hx    Social History   Socioeconomic History   Marital status: Married    Spouse name: Wayne Holland   Number of children: Not on file   Years of education: Not on file   Highest education level: 12th grade  Occupational History   Not on file  Tobacco Use   Smoking status: Former    Current packs/day: 0.00    Types: Cigarettes    Quit date: 06/27/1975    Years since quitting: 47.5    Passive exposure: Past   Smokeless tobacco: Never   Tobacco comments:    QUIT IN 1970'S  Substance and Sexual Activity   Alcohol use: Yes    Alcohol/week: 1.0 standard drink of alcohol    Types: 1 Cans of beer per week    Comment: OCCASIONALLY   Drug use: No   Sexual activity: Yes    Birth control/protection: None  Other Topics Concern   Not on file  Social History Narrative   Not on file   Social Determinants of Corporate investment banker  Strain: Low Risk  (01/06/2023)   Overall Financial Resource Strain (CARDIA)    Difficulty of Paying Living Expenses: Not hard at all  Food Insecurity: No Food Insecurity (01/06/2023)   Hunger Vital Sign    Worried About Running Out of Food in the Last Year: Never true    Ran Out of Food in the Last Year: Never true  Transportation Needs: No Transportation Needs (01/06/2023)   PRAPARE - Administrator, Civil Service (Medical): No    Lack of Transportation (Non-Medical): No  Physical Activity: Sufficiently Active (01/06/2023)   Exercise Vital Sign    Days of Exercise per Week: 5 days    Minutes of Exercise per Session: 40 min  Stress: No Stress Concern Present (01/06/2023)   [employer] of Occupational Health - Occupational  Stress Questionnaire    Feeling of Stress : Not at all  Social Connections: Moderately Isolated (01/06/2023)   Social Connection and Isolation Panel [NHANES]    Frequency of Communication with Friends and Family: Once a week    Frequency of Social Gatherings with Friends and Family: Once a week    Attends Religious Services: More than 4 times per year    Active Member of [employer] or Organizations: No    Attends Engineer, structural: Never    Marital Status: Married    Tobacco Counseling Counseling given: Not Answered Tobacco comments: QUIT IN 1970'S   Clinical Intake:  Pre-visit preparation completed: Yes  Pain : No/denies pain   BMI - recorded: 24.99 Nutritional Status: BMI of 19-24  Normal Nutritional Risks: None Diabetes: No  How often do you need to have someone help you when you read instructions, pamphlets, or other written materials from your doctor or pharmacy?: 1 - Never  Interpreter Needed?: No  Comments: lives wife Information entered by :: B.Fields Oros,LPN   Activities of Daily Living    01/06/2023    4:42 PM 10/04/2022    9:57 AM  In your present state of health, do you have any difficulty performing the following activities:  Hearing? 0 0  Vision? 0 0  Difficulty concentrating or making decisions? 0 0  Walking or climbing stairs? 0 0  Dressing or bathing? 0 0  Doing errands, shopping? 0   Preparing Food and eating ? N   Using the Toilet? N   In the past six months, have you accidently leaked urine? N   Do you have problems with loss of bowel control? N   Managing your Medications? N   Managing your Finances? N   Housekeeping or managing your Housekeeping? N     Patient Care Team: Wayne Lat, PA-C as PCP - General (Physician Assistant) Wayne Holland, Wayne Cipro, PA-C (Inactive) (Family Medicine) Wayne Livings Merrily Pew, MD (General Surgery)  Indicate any recent Medical Services you may have received from other than [facility] providers in the past year  (date may be approximate).     Assessment:   This is a routine wellness examination for Wayne Holland.  Hearing/Vision screen Hearing Screening - Comments:: Adequate hearing Vision Screening - Comments:: Adequate vision Dr Larence Penning  Dietary issues and exercise activities discussed:     Goals Addressed             This Visit's Progress    DIET - EAT MORE FRUITS AND VEGETABLES   On track      Depression Screen    01/08/2023    8:10 AM 10/04/2022    9:57 AM 01/03/2022  8:15 AM 12/13/2021    8:47 AM 01/20/2021    8:42 AM 10/19/2020   10:02 AM 07/22/2020   10:06 AM  PHQ 2/9 Scores  PHQ - 2 Score 0 0 0 0 0 0 0  PHQ- 9 Score  0 0 0 0 0 0    Fall Risk    01/06/2023    4:42 PM 10/04/2022    9:57 AM 01/03/2022    8:19 AM 12/30/2021    8:29 PM 12/13/2021    8:47 AM  Fall Risk   Falls in the past year? 0 0 0 0 0  Number falls in past yr: 0 0 0 0 0  Injury with Fall? 0 0 0 0 0  Risk for fall due to :   No Fall Risks  No Fall Risks  Follow up Falls prevention discussed;Education provided  Falls evaluation completed  Falls evaluation completed    MEDICARE RISK AT HOME:   TIMED UP AND GO:  Was the test performed?  Yes  Length of time to ambulate 10 feet: 8 sec Gait steady and fast without use of assistive device    Cognitive Function:        01/08/2023    8:19 AM  6CIT Screen  What Year? 0 points  What month? 0 points  What time? 0 points  Count back from 20 0 points  Months in reverse 0 points  Repeat phrase 0 points  Total Score 0 points    Immunizations Immunization History  Administered Date(s) Administered   Influenza Split 04/09/2012   Influenza, High Dose Seasonal PF 05/08/2021   Influenza,inj,Quad PF,6+ Mos 04/17/2013, 04/24/2014, 04/26/2015, 05/04/2016, 05/10/2017, 05/13/2018, 03/27/2019, 05/11/2020   Influenza-Unspecified 03/27/2019   PFIZER Comirnaty(Gray Top)Covid-19 Tri-Sucrose Vaccine 11/15/2020   PFIZER(Purple Top)SARS-COV-2 Vaccination 08/31/2019,  09/22/2019, 05/19/2020   Tdap 04/07/2011   Zoster Recombinant(Shingrix) 11/18/2022   Zoster, Live 05/04/2016    TDAP status: Up to date  Flu Vaccine status: Declined, Education has been provided regarding the importance of this vaccine but patient still declined. Advised may receive this vaccine at local pharmacy or Health Dept. Aware to provide a copy of the vaccination record if obtained from local pharmacy or Health Dept. Verbalized acceptance and understanding.  Pneumococcal vaccine status: Due, Education has been provided regarding the importance of this vaccine. Advised may receive this vaccine at local pharmacy or Health Dept. Aware to provide a copy of the vaccination record if obtained from local pharmacy or Health Dept. Verbalized acceptance and understanding.  Covid-19 vaccine status: Completed vaccines  Qualifies for Shingles Vaccine? Yes   Zostavax completed Yes  #1 ; #2 scheduled for 8/24 Shingrix Completed?: No.    Education has been provided regarding the importance of this vaccine. Patient has been advised to call insurance company to determine out of pocket expense if they have not yet received this vaccine. Advised may also receive vaccine at local pharmacy or Health Dept. Verbalized acceptance and understanding.  Screening Tests Health Maintenance  Topic Date Due   Pneumonia Vaccine 61+ Years old (1 of 1 - PCV) Never done   DTaP/Tdap/Td (2 - Td or Tdap) 04/06/2021   COVID-19 Vaccine (5 - 2023-24 season) 02/24/2022   Zoster Vaccines- Shingrix (2 of 2) 01/13/2023   INFLUENZA VACCINE  01/25/2023   Medicare Annual Wellness (AWV)  01/08/2024   Colonoscopy  06/28/2026   Hepatitis C Screening  Completed   HPV VACCINES  Aged Out    Health Maintenance  Health Maintenance Due  Topic Date Due   Pneumonia Vaccine 59+ Years old (1 of 1 - PCV) Never done   DTaP/Tdap/Td (2 - Td or Tdap) 04/06/2021   COVID-19 Vaccine (5 - 2023-24 season) 02/24/2022    Colorectal cancer  screening: Type of screening: Colonoscopy. Completed yes. Repeat every 5 years  Lung Cancer Screening: (Low Dose CT Chest recommended if Age 38-80 years, 20 pack-year currently smoking OR have quit w/in 15years.) does not qualify.   Lung Cancer Screening Referral: no  Additional Screening:  Hepatitis C Screening: does not qualify; Completed yes  Vision Screening: Recommended annual ophthalmology exams for early detection of glaucoma and other disorders of the eye. Is the patient up to date with their annual eye exam?  Yes  Who is the provider or what is the name of the office in which the patient attends annual eye exams? Dr Larence Penning If pt is not established with a provider, would they like to be referred to a provider to establish care? No .   Dental Screening: Recommended annual dental exams for proper oral hygiene  Diabetic Foot Exam: n/a  Community Resource Referral / Chronic Care Management: CRR required this visit?  No   CCM required this visit?  No   Plan:     I have personally reviewed and noted the following in the patient's chart:   Medical and social history Use of alcohol, tobacco or illicit drugs  Current medications and supplements including opioid prescriptions. Patient is not currently taking opioid prescriptions. Functional ability and status Nutritional status Physical activity Advanced directives List of other physicians Hospitalizations, surgeries, and ER visits in previous 12 months Vitals Screenings to include cognitive, depression, and falls Referrals and appointments  In addition, I have reviewed and discussed with patient certain preventive protocols, quality metrics, and best practice recommendations. A written personalized care plan for preventive services as well as general preventive health recommendations were provided to patient.    Sue Lush, LPN   11/25/930   After Visit Summary: (MyChart) Due to this being a telephonic visit, the  after visit summary with patients personalized plan was offered to patient via MyChart   Nurse Notes: The patient states he is doing well. He relays had an appt on Friday but only did the labwork but did received message from PCP of normal values. He inquires does he need to r/s that appt as he has another in October.

## 2023-01-08 NOTE — Patient Instructions (Addendum)
Wayne Holland , Thank you for taking time to come for your Medicare Wellness Visit. I appreciate your ongoing commitment to your health goals. Please review the following plan we discussed and let me know if I can assist you in the future.   These are the goals we discussed:  Goals      DIET - EAT MORE FRUITS AND VEGETABLES        This is a list of the screening recommended for you and due dates:  Health Maintenance  Topic Date Due   Pneumonia Vaccine (1 of 1 - PCV) Never done   DTaP/Tdap/Td vaccine (2 - Td or Tdap) 04/06/2021   COVID-19 Vaccine (5 - 2023-24 season) 02/24/2022   Zoster (Shingles) Vaccine (2 of 2) 01/13/2023   Flu Shot  01/25/2023   Medicare Annual Wellness Visit  01/08/2024   Colon Cancer Screening  06/28/2026   Hepatitis C Screening  Completed   HPV Vaccine  Aged Out    Advanced directives: no  Conditions/risks identified: none  Next appointment: Follow up in one year for your annual wellness visit. 01/09/2024 @ 8:45am in person  Preventive Care 65 Years and Older, Male  Preventive care refers to lifestyle choices and visits with your health care provider that can promote health and wellness. What does preventive care include? A yearly physical exam. This is also called an annual well check. Dental exams once or twice a year. Routine eye exams. Ask your health care provider how often you should have your eyes checked. Personal lifestyle choices, including: Daily care of your teeth and gums. Regular physical activity. Eating a healthy diet. Avoiding tobacco and drug use. Limiting alcohol use. Practicing safe sex. Taking low doses of aspirin every day. Taking vitamin and mineral supplements as recommended by your health care provider. What happens during an annual well check? The services and screenings done by your health care provider during your annual well check will depend on your age, overall health, lifestyle risk factors, and family history of  disease. Counseling  Your health care provider may ask you questions about your: Alcohol use. Tobacco use. Drug use. Emotional well-being. Home and relationship well-being. Sexual activity. Eating habits. History of falls. Memory and ability to understand (cognition). Work and work Astronomer. Screening  You may have the following tests or measurements: Height, weight, and BMI. Blood pressure. Lipid and cholesterol levels. These may be checked every 5 years, or more frequently if you are over 65 years old. Skin check. Lung cancer screening. You may have this screening every year starting at age 81 if you have a 30-pack-year history of smoking and currently smoke or have quit within the past 15 years. Fecal occult blood test (FOBT) of the stool. You may have this test every year starting at age 17. Flexible sigmoidoscopy or colonoscopy. You may have a sigmoidoscopy every 5 years or a colonoscopy every 10 years starting at age 65. Prostate cancer screening. Recommendations will vary depending on your family history and other risks. Hepatitis C blood test. Hepatitis B blood test. Sexually transmitted disease (STD) testing. Diabetes screening. This is done by checking your blood sugar (glucose) after you have not eaten for a while (fasting). You may have this done every 1-3 years. Abdominal aortic aneurysm (AAA) screening. You may need this if you are a current or former smoker. Osteoporosis. You may be screened starting at age 17 if you are at high risk. Talk with your health care provider about your test results, treatment  options, and if necessary, the need for more tests. Vaccines  Your health care provider may recommend certain vaccines, such as: Influenza vaccine. This is recommended every year. Tetanus, diphtheria, and acellular pertussis (Tdap, Td) vaccine. You may need a Td booster every 10 years. Zoster vaccine. You may need this after age 27. Pneumococcal 13-valent  conjugate (PCV13) vaccine. One dose is recommended after age 65. Pneumococcal polysaccharide (PPSV23) vaccine. One dose is recommended after age 34. Talk to your health care provider about which screenings and vaccines you need and how often you need them. This information is not intended to replace advice given to you by your health care provider. Make sure you discuss any questions you have with your health care provider. Document Released: 07/09/2015 Document Revised: 03/01/2016 Document Reviewed: 04/13/2015 Elsevier Interactive Patient Education  2017 [employer].  Fall Prevention in the Home Falls can cause injuries. They can happen to people of all ages. There are many things you can do to make your home safe and to help prevent falls. What can I do on the outside of my home? Regularly fix the edges of walkways and driveways and fix any cracks. Remove anything that might make you trip as you walk through a door, such as a raised step or threshold. Trim any bushes or trees on the path to your home. Use bright outdoor lighting. Clear any walking paths of anything that might make someone trip, such as rocks or tools. Regularly check to see if handrails are loose or broken. Make sure that both sides of any steps have handrails. Any raised decks and porches should have guardrails on the edges. Have any leaves, snow, or ice cleared regularly. Use sand or salt on walking paths during winter. Clean up any spills in your garage right away. This includes oil or grease spills. What can I do in the bathroom? Use night lights. Install grab bars by the toilet and in the tub and shower. Do not use towel bars as grab bars. Use non-skid mats or decals in the tub or shower. If you need to sit down in the shower, use a plastic, non-slip stool. Keep the floor dry. Clean up any water that spills on the floor as soon as it happens. Remove soap buildup in the tub or shower regularly. Attach bath mats  securely with double-sided non-slip rug tape. Do not have throw rugs and other things on the floor that can make you trip. What can I do in the bedroom? Use night lights. Make sure that you have a light by your bed that is easy to reach. Do not use any sheets or blankets that are too big for your bed. They should not hang down onto the floor. Have a firm chair that has side arms. You can use this for support while you get dressed. Do not have throw rugs and other things on the floor that can make you trip. What can I do in the kitchen? Clean up any spills right away. Avoid walking on wet floors. Keep items that you use a lot in easy-to-reach places. If you need to reach something above you, use a strong step stool that has a grab bar. Keep electrical cords out of the way. Do not use floor polish or wax that makes floors slippery. If you must use wax, use non-skid floor wax. Do not have throw rugs and other things on the floor that can make you trip. What can I do with my stairs? Do not  leave any items on the stairs. Make sure that there are handrails on both sides of the stairs and use them. Fix handrails that are broken or loose. Make sure that handrails are as long as the stairways. Check any carpeting to make sure that it is firmly attached to the stairs. Fix any carpet that is loose or worn. Avoid having throw rugs at the top or bottom of the stairs. If you do have throw rugs, attach them to the floor with carpet tape. Make sure that you have a light switch at the top of the stairs and the bottom of the stairs. If you do not have them, ask someone to add them for you. What else can I do to help prevent falls? Wear shoes that: Do not have high heels. Have rubber bottoms. Are comfortable and fit you well. Are closed at the toe. Do not wear sandals. If you use a stepladder: Make sure that it is fully opened. Do not climb a closed stepladder. Make sure that both sides of the stepladder  are locked into place. Ask someone to hold it for you, if possible. Clearly mark and make sure that you can see: Any grab bars or handrails. First and last steps. Where the edge of each step is. Use tools that help you move around (mobility aids) if they are needed. These include: Canes. Walkers. Scooters. Crutches. Turn on the lights when you go into a dark area. Replace any light bulbs as soon as they burn out. Set up your furniture so you have a clear path. Avoid moving your furniture around. If any of your floors are uneven, fix them. If there are any pets around you, be aware of where they are. Review your medicines with your doctor. Some medicines can make you feel dizzy. This can increase your chance of falling. Ask your doctor what other things that you can do to help prevent falls. This information is not intended to replace advice given to you by your health care provider. Make sure you discuss any questions you have with your health care provider. Document Released: 04/08/2009 Document Revised: 11/18/2015 Document Reviewed: 07/17/2014 Elsevier Interactive Patient Education  2017 [employer].

## 2023-02-10 ENCOUNTER — Other Ambulatory Visit: Payer: Self-pay | Admitting: Physician Assistant

## 2023-02-10 DIAGNOSIS — E7800 Pure hypercholesterolemia, unspecified: Secondary | ICD-10-CM

## 2023-03-07 ENCOUNTER — Other Ambulatory Visit: Payer: Self-pay | Admitting: Physician Assistant

## 2023-03-07 DIAGNOSIS — I10 Essential (primary) hypertension: Secondary | ICD-10-CM

## 2023-04-06 ENCOUNTER — Encounter: Payer: Self-pay | Admitting: Physician Assistant

## 2023-04-06 ENCOUNTER — Ambulatory Visit (INDEPENDENT_AMBULATORY_CARE_PROVIDER_SITE_OTHER): Payer: Medicare Other | Admitting: Physician Assistant

## 2023-04-06 VITALS — BP 132/8 | HR 58 | Temp 97.8°F | Ht 71.0 in | Wt 180.0 lb

## 2023-04-06 DIAGNOSIS — Z23 Encounter for immunization: Secondary | ICD-10-CM | POA: Diagnosis not present

## 2023-04-06 DIAGNOSIS — R7309 Other abnormal glucose: Secondary | ICD-10-CM | POA: Diagnosis not present

## 2023-04-06 DIAGNOSIS — I10 Essential (primary) hypertension: Secondary | ICD-10-CM

## 2023-04-06 DIAGNOSIS — H40023 Open angle with borderline findings, high risk, bilateral: Secondary | ICD-10-CM | POA: Diagnosis not present

## 2023-04-06 DIAGNOSIS — R7989 Other specified abnormal findings of blood chemistry: Secondary | ICD-10-CM

## 2023-04-06 DIAGNOSIS — E7800 Pure hypercholesterolemia, unspecified: Secondary | ICD-10-CM

## 2023-04-06 DIAGNOSIS — H2513 Age-related nuclear cataract, bilateral: Secondary | ICD-10-CM | POA: Diagnosis not present

## 2023-04-06 MED ORDER — LISINOPRIL 5 MG PO TABS
5.0000 mg | ORAL_TABLET | Freq: Every day | ORAL | 0 refills | Status: DC
Start: 2023-04-06 — End: 2023-08-29

## 2023-04-06 MED ORDER — PRAVASTATIN SODIUM 40 MG PO TABS
40.0000 mg | ORAL_TABLET | Freq: Every day | ORAL | 1 refills | Status: DC
Start: 2023-04-06 — End: 2023-10-02

## 2023-04-06 NOTE — Progress Notes (Signed)
Established patient visit  Patient: Wayne Holland   DOB: Jun 08, 1956   67 y.o. Male  MRN: [id number] Visit Date: 04/06/2023  Today's healthcare provider: Debera Lat, PA-C   Chief Complaint  Patient presents with   Hypertension    Patient was last seen in April.  He on occasion checks his blood pressure at home and has had good readings.     Hypothyroidism    Lab Results      Component                Value               Date                      TSH                      0.357 (L)           10/04/2022                T4TOTAL                  8.3                 01/20/2021              Hyperlipidemia    Last checked in July Lab Results      Component                Value               Date                      CHOL                     146                 01/05/2023                HDL                      60                  01/05/2023                LDLCALC                  73                  01/05/2023                TRIG                     61                  01/05/2023                CHOLHDL                  3.2                 10/04/2022              Hyperglycemia    Last seen for this in April.  Does not check glucose at home. Lab Results      Component  Value               Date                      HGBA1C                   6.1 (H)             10/04/2022              Subjective     Discussed the use of AI scribe software for clinical note transcription with the patient, who gave verbal consent to proceed.  History of Present Illness   The patient presents for a routine follow-up. They report no new symptoms or changes in their health since their last visit. They have been managing their borderline diabetes through diet and have not required medication. They are also on Lisinopril and Pravastatin for hypertension and hyperlipidemia respectively.  Six months ago, the patient had slightly low TSH but normal T4 levels. They deny symptoms of hyperthyroidism  such as rapid heart beating, nervousness, or anxiety. They also deny symptoms of hypothyroidism such as heat intolerance, cold intolerance, dry skin, or severe fatigue.  Three years ago, the patient underwent a procedure to shave down the prostate and remove bladder stones. They report no current issues with urination.  The patient also reports no current issues with bowel movements, belly pain, nausea, vomiting, or leg problems. They recently saw an optometrist and are due for a field vision test. They deny blurry or double vision.     Ldl 73      04/06/2023    7:82 AM 01/08/2023    8:10 AM 10/04/2022    9:57 AM  Depression screen PHQ 2/9  Decreased Interest 0 0 0  Down, Depressed, Hopeless 0 0 0  PHQ - 2 Score 0 0 0  Altered sleeping 0  0  Tired, decreased energy 0  0  Change in appetite 0  0  Feeling bad or failure about yourself  0  0  Trouble concentrating 0  0  Moving slowly or fidgety/restless 0  0  Suicidal thoughts 0  0  PHQ-9 Score 0  0  Difficult doing work/chores Not difficult at all  Not difficult at all      04/06/2023    8:12 AM  GAD 7 : Generalized Anxiety Score  Nervous, Anxious, on Edge 0  Control/stop worrying 0  Worry too much - different things 0  Trouble relaxing 0  Restless 0  Easily annoyed or irritable 0  Afraid - awful might happen 0  Total GAD 7 Score 0  Anxiety Difficulty Not difficult at all    Medications: Outpatient Medications Prior to Visit  Medication Sig   [DISCONTINUED] lisinopril (ZESTRIL) 5 MG tablet TAKE 1 TABLET (5 MG TOTAL) BY MOUTH DAILY.   [DISCONTINUED] pravastatin (PRAVACHOL) 40 MG tablet Take 1 tablet (40 mg total) by mouth daily.   No facility-administered medications prior to visit.    Review of Systems  All other systems reviewed and are negative.  Except see HPI       Objective    BP (!) 132/8 (BP Location: Left Arm, Patient Position: Sitting, Cuff Size: Normal)   Pulse (!) 58   Temp 97.8 F (36.6 C)  (Oral)   Ht 5\' 11"  (1.803 m)   Wt 180 lb (81.6 kg)   SpO2 100%   BMI 25.10 kg/m  Physical Exam Vitals reviewed.  Constitutional:      General: He is not in acute distress.    Appearance: Normal appearance. He is not diaphoretic.  HENT:     Head: Normocephalic and atraumatic.  Eyes:     General: No scleral icterus.    Conjunctiva/sclera: Conjunctivae normal.  Cardiovascular:     Rate and Rhythm: Normal rate and regular rhythm.     Pulses: Normal pulses.     Heart sounds: Normal heart sounds. No murmur heard. Pulmonary:     Effort: Pulmonary effort is normal. No respiratory distress.     Breath sounds: Normal breath sounds. No wheezing or rhonchi.  Musculoskeletal:     Cervical back: Neck supple.     Right lower leg: No edema.     Left lower leg: No edema.  Lymphadenopathy:     Cervical: No cervical adenopathy.  Skin:    General: Skin is warm and dry.     Findings: No rash.  Neurological:     Mental Status: He is alert and oriented to person, place, and time. Mental status is at baseline.  Psychiatric:        Mood and Affect: Mood normal.        Behavior: Behavior normal.      No results found for any visits on 04/06/23.  Assessment & Plan        Borderline Diabetes Slightly elevated blood sugar on recent labs. Patient manages condition through diet and exercise. -Repeat blood work to monitor blood sugar levels.  Hyperlipidemia Chronic and stable Patient is on Pravastatin 40mg . -Refill Pravastatin prescription. Recheck LP  Hypertension Chronic and stable Patient is on Lisinopril 5mg  -Refill Lisinopril prescription. Lipids control, on statin Repeat CMP  Subclinical Hyperthyroidism Slightly low TSH with normal T4 on recent labs. No symptoms of hyperthyroidism reported. -Repeat TSH to monitor thyroid function.  General Health Maintenance -Continue diet and exercise regimen. -Recommended ophthalmologist visit due to borderline diabetes. -Follow-up  visit in six months.      Return in about 6 months (around 10/05/2023) for chronic disease f/u.     The patient was advised to call back or seek an in-person evaluation if the symptoms worsen or if the condition fails to improve as anticipated.  I discussed the assessment and treatment plan with the patient. The patient was provided an opportunity to ask questions and all were answered. The patient agreed with the plan and demonstrated an understanding of the instructions. 04/06/23 for the exam, diagnosis, procedures, and orders are all accurate and complete.  Debera Lat, [facility], MMS [facility] [phone] (phone) [phone] (fax)  [facility]

## 2023-04-07 LAB — CBC WITH DIFFERENTIAL/PLATELET
Basophils Absolute: 0 10*3/uL (ref 0.0–0.2)
Basos: 1 %
EOS (ABSOLUTE): 0.2 10*3/uL (ref 0.0–0.4)
Eos: 3 %
Hematocrit: 48.7 % (ref 37.5–51.0)
Hemoglobin: 15.8 g/dL (ref 13.0–17.7)
Immature Grans (Abs): 0 10*3/uL (ref 0.0–0.1)
Immature Granulocytes: 0 %
Lymphocytes Absolute: 2.4 10*3/uL (ref 0.7–3.1)
Lymphs: 41 %
MCH: 30.4 pg (ref 26.6–33.0)
MCHC: 32.4 g/dL (ref 31.5–35.7)
MCV: 94 fL (ref 79–97)
Monocytes Absolute: 0.5 10*3/uL (ref 0.1–0.9)
Monocytes: 9 %
Neutrophils Absolute: 2.7 10*3/uL (ref 1.4–7.0)
Neutrophils: 46 %
Platelets: 254 10*3/uL (ref 150–450)
RBC: 5.19 x10E6/uL (ref 4.14–5.80)
RDW: 12.8 % (ref 11.6–15.4)
WBC: 5.8 10*3/uL (ref 3.4–10.8)

## 2023-04-07 LAB — TSH: TSH: 0.403 u[IU]/mL — ABNORMAL LOW (ref 0.450–4.500)

## 2023-04-07 LAB — T4, FREE: Free T4: 1.29 ng/dL (ref 0.82–1.77)

## 2023-04-07 LAB — HEMOGLOBIN A1C
Est. average glucose Bld gHb Est-mCnc: 134 mg/dL
Hgb A1c MFr Bld: 6.3 % — ABNORMAL HIGH (ref 4.8–5.6)

## 2023-08-29 ENCOUNTER — Other Ambulatory Visit: Payer: Self-pay | Admitting: Physician Assistant

## 2023-08-29 DIAGNOSIS — I10 Essential (primary) hypertension: Secondary | ICD-10-CM

## 2023-08-29 NOTE — Telephone Encounter (Signed)
 Requested Prescriptions  Pending Prescriptions Disp Refills   lisinopril (ZESTRIL) 5 MG tablet [Pharmacy Med Name: LISINOPRIL 5 MG TABLET] 90 tablet 0    Sig: TAKE 1 TABLET (5 MG TOTAL) BY MOUTH DAILY.     Cardiovascular:  ACE Inhibitors Failed - 08/29/2023  3:39 PM      Failed - Cr in normal range and within 180 days    Creat  Date Value Ref Range Status  05/10/2017 0.91 0.70 - 1.25 mg/dL Final    Comment:    For patients >68 years of age, the reference limit for Creatinine is approximately 13% higher for people identified as African-American. .    Creatinine, Ser  Date Value Ref Range Status  01/05/2023 0.92 0.76 - 1.27 mg/dL Final         Failed - K in normal range and within 180 days    Potassium  Date Value Ref Range Status  01/05/2023 4.9 3.5 - 5.2 mmol/L Final         Failed - Last BP in normal range    BP Readings from Last 1 Encounters:  04/06/23 (!) 132/8         Passed - Patient is not pregnant      Passed - Valid encounter within last 6 months    Recent Outpatient Visits           4 months ago Pure hypercholesterolemia   [place] [facility] Merlin, [place], PA-C   10 months ago Annual physical exam   [place] [facility] Murray, [place], PA-C   1 year ago Primary hypertension   [place] [facility] Daven[place], [place], PA-C   2 years ago Elevated glucose   [place] [facility] Chrismon, Jodell Cipro, PA-C   2 years ago Essential hypertension   [place] [facility] Just, Azalee Course, FNP       Future Appointments             In 1 month Ostwalt, Edmon Crape, PA-C [place] [facility], [facility]

## 2023-09-30 ENCOUNTER — Other Ambulatory Visit: Payer: Self-pay | Admitting: Physician Assistant

## 2023-09-30 DIAGNOSIS — I10 Essential (primary) hypertension: Secondary | ICD-10-CM

## 2023-09-30 DIAGNOSIS — E7800 Pure hypercholesterolemia, unspecified: Secondary | ICD-10-CM

## 2023-10-02 NOTE — Telephone Encounter (Signed)
 Requested Prescriptions  Pending Prescriptions Disp Refills   pravastatin (PRAVACHOL) 40 MG tablet [Pharmacy Med Name: PRAVASTATIN SODIUM 40 MG TAB] 90 tablet 1    Sig: TAKE 1 TABLET BY MOUTH EVERY DAY     Cardiovascular:  Antilipid - Statins Failed - 10/02/2023  8:56 AM      Failed - Valid encounter within last 12 months    Recent Outpatient Visits   None     Future Appointments             In 3 days Debera Lat, PA-C [place] [facility]            Failed - Lipid Panel in normal range within the last 12 months    Cholesterol, Total  Date Value Ref Range Status  01/05/2023 146 100 - 199 mg/dL Final   LDL Cholesterol (Calc)  Date Value Ref Range Status  05/10/2017 89 mg/dL (calc) Final    Comment:    Reference range: <100 . Desirable range <100 mg/dL for primary prevention;   <70 mg/dL for patients with CHD or diabetic patients  with > or = 2 CHD risk factors. Marland Kitchen LDL-C is now calculated using the Martin-Hopkins  calculation, which is a validated novel method providing  better accuracy than the Friedewald equation in the  estimation of LDL-C.  Horald Pollen et al. Lenox Ahr. 4540;981(19): 2061-2068  (http://education.[employer].com/faq/FAQ164)    LDL Chol Calc (NIH)  Date Value Ref Range Status  01/05/2023 73 0 - 99 mg/dL Final   HDL  Date Value Ref Range Status  01/05/2023 60 >39 mg/dL Final   Triglycerides  Date Value Ref Range Status  01/05/2023 61 0 - 149 mg/dL Final         Passed - Patient is not pregnant

## 2023-10-05 ENCOUNTER — Encounter: Payer: Self-pay | Admitting: Physician Assistant

## 2023-10-05 ENCOUNTER — Ambulatory Visit (INDEPENDENT_AMBULATORY_CARE_PROVIDER_SITE_OTHER): Payer: Self-pay | Admitting: Physician Assistant

## 2023-10-05 VITALS — BP 118/76 | HR 61 | Resp 16 | Ht 71.0 in | Wt 184.3 lb

## 2023-10-05 DIAGNOSIS — I10 Essential (primary) hypertension: Secondary | ICD-10-CM | POA: Diagnosis not present

## 2023-10-05 DIAGNOSIS — R7309 Other abnormal glucose: Secondary | ICD-10-CM

## 2023-10-05 DIAGNOSIS — R7303 Prediabetes: Secondary | ICD-10-CM

## 2023-10-05 DIAGNOSIS — R7989 Other specified abnormal findings of blood chemistry: Secondary | ICD-10-CM

## 2023-10-05 DIAGNOSIS — E7800 Pure hypercholesterolemia, unspecified: Secondary | ICD-10-CM | POA: Diagnosis not present

## 2023-10-05 NOTE — Progress Notes (Signed)
 Established patient visit  Patient: Wayne Holland   DOB: June 15, 1956   68 y.o. Male  MRN: [id number] Visit Date: 10/05/2023  Today's healthcare provider: Blane Bunting, PA-C   Chief Complaint  Patient presents with   Chronic Disease     F/u up on chronic disease .Aaron Aas No other concenrs   Subjective     Discussed the use of AI scribe software for clinical note transcription with the patient, who gave verbal consent to proceed.  History of Present Illness The patient, with a history of prediabetes, hypertension, and hyperlipidemia, presents for a routine follow-up. He reports no new symptoms and is compliant with his current medication regimen, which includes pravastatin 40 mg and lisinopril 5 mg. He has not been monitoring his blood sugar levels at home but agrees to start doing so. He was previously under the care of an endocrinologist, who has since released him from regular follow-ups unless new issues arise. The patient has not had a tetanus shot in over ten years and is due for one. He is also due for an annual wellness check in July. He reports no issues with anxiety or depression and is trying to maintain a low-carb diet. He has no visual problems and had an eye exam last summer, which did not require any changes to his prescription. He reports no chest pain, shortness of breath, rapid heart beating, problems with bowel movements, blood in stool or urine, problems with urination, or swelling. He does report being sensitive to cold, particularly in his hands and feet.   Tdap/ 01/08/24 CPE    10/05/2023    8:52 AM 04/06/2023    8:12 AM 01/08/2023    8:10 AM  Depression screen PHQ 2/9  Decreased Interest 0 0 0  Down, Depressed, Hopeless 0 0 0  PHQ - 2 Score 0 0 0  Altered sleeping 0 0   Tired, decreased energy 0 0   Change in appetite 0 0   Feeling bad or failure about yourself  0 0   Trouble concentrating 0 0   Moving slowly or fidgety/restless 0 0   Suicidal thoughts 0 0    PHQ-9 Score 0 0   Difficult doing work/chores  Not difficult at all       10/05/2023    8:52 AM 04/06/2023    8:12 AM  GAD 7 : Generalized Anxiety Score  Nervous, Anxious, on Edge 0 0  Control/stop worrying 0 0  Worry too much - different things 0 0  Trouble relaxing 0 0  Restless 0 0  Easily annoyed or irritable 0 0  Afraid - awful might happen 0 0  Total GAD 7 Score 0 0  Anxiety Difficulty Not difficult at all Not difficult at all    Medications: Outpatient Medications Prior to Visit  Medication Sig   lisinopril (ZESTRIL) 5 MG tablet TAKE 1 TABLET (5 MG TOTAL) BY MOUTH DAILY.   pravastatin (PRAVACHOL) 40 MG tablet TAKE 1 TABLET BY MOUTH EVERY DAY   No facility-administered medications prior to visit.    Review of Systems All negative Except see HPI       Objective    BP 118/76 (BP Location: Left Arm, Patient Position: Sitting)   Pulse 61   Resp 16   Ht 5\' 11"  (1.803 m)   Wt 184 lb 4.8 oz (83.6 kg)   SpO2 100%   BMI 25.70 kg/m     Physical Exam Vitals reviewed.  Constitutional:  General: He is not in acute distress.    Appearance: Normal appearance. He is not diaphoretic.  HENT:     Head: Normocephalic and atraumatic.  Eyes:     General: No scleral icterus.    Conjunctiva/sclera: Conjunctivae normal.  Cardiovascular:     Rate and Rhythm: Normal rate and regular rhythm.     Pulses: Normal pulses.     Heart sounds: Normal heart sounds. No murmur heard. Pulmonary:     Effort: Pulmonary effort is normal. No respiratory distress.     Breath sounds: Normal breath sounds. No wheezing or rhonchi.  Musculoskeletal:     Cervical back: Neck supple.     Right lower leg: No edema.     Left lower leg: No edema.  Lymphadenopathy:     Cervical: No cervical adenopathy.  Skin:    General: Skin is warm and dry.     Findings: No rash.  Neurological:     Mental Status: He is alert and oriented to person, place, and time. Mental status is at baseline.   Psychiatric:        Mood and Affect: Mood normal.        Behavior: Behavior normal.     Results for orders placed or performed in visit on 10/05/23  Hemoglobin A1c  Result Value Ref Range   Hgb A1c MFr Bld 5.9 (H) 4.8 - 5.6 %   Est. average glucose Bld gHb Est-mCnc 123 mg/dL  Lipid panel  Result Value Ref Range   Cholesterol, Total 165 100 - 199 mg/dL   Triglycerides 69 0 - 149 mg/dL   HDL 55 >78 mg/dL   VLDL Cholesterol Cal 13 5 - 40 mg/dL   LDL Chol Calc (NIH) 97 0 - 99 mg/dL   Chol/HDL Ratio 3.0 0.0 - 5.0 ratio  TSH  Result Value Ref Range   TSH 0.368 (L) 0.450 - 4.500 uIU/mL  T4, free  Result Value Ref Range   Free T4 1.36 0.82 - 1.77 ng/dL  Comprehensive metabolic panel with GFR  Result Value Ref Range   Glucose 111 (H) 70 - 99 mg/dL   BUN 13 8 - 27 mg/dL   Creatinine, Ser 2.95 0.76 - 1.27 mg/dL   eGFR 78 >62 ZH/YQM/5.78   BUN/Creatinine Ratio 12 10 - 24   Sodium 140 134 - 144 mmol/L   Potassium 4.8 3.5 - 5.2 mmol/L   Chloride 104 96 - 106 mmol/L   CO2 21 20 - 29 mmol/L   Calcium 9.8 8.6 - 10.2 mg/dL   Total Protein 6.8 6.0 - 8.5 g/dL   Albumin 4.4 3.9 - 4.9 g/dL   Globulin, Total 2.4 1.5 - 4.5 g/dL   Bilirubin Total 0.7 0.0 - 1.2 mg/dL   Alkaline Phosphatase 76 44 - 121 IU/L   AST 22 0 - 40 IU/L   ALT 32 0 - 44 IU/L  CBC with Differential/Platelet  Result Value Ref Range   WBC 5.2 3.4 - 10.8 x10E3/uL   RBC 5.06 4.14 - 5.80 x10E6/uL   Hemoglobin 16.4 13.0 - 17.7 g/dL   Hematocrit 46.9 62.9 - 51.0 %   MCV 92 79 - 97 fL   MCH 32.4 26.6 - 33.0 pg   MCHC 35.2 31.5 - 35.7 g/dL   RDW 52.8 41.3 - 24.4 %   Platelets 235 150 - 450 x10E3/uL   Neutrophils 54 Not Estab. %   Lymphs 35 Not Estab. %   Monocytes 9 Not Estab. %   Eos  1 Not Estab. %   Basos 1 Not Estab. %   Neutrophils Absolute 2.8 1.4 - 7.0 x10E3/uL   Lymphocytes Absolute 1.8 0.7 - 3.1 x10E3/uL   Monocytes Absolute 0.5 0.1 - 0.9 x10E3/uL   EOS (ABSOLUTE) 0.1 0.0 - 0.4 x10E3/uL   Basophils Absolute  0.0 0.0 - 0.2 x10E3/uL   Immature Granulocytes 0 Not Estab. %   Immature Grans (Abs) 0.0 0.0 - 0.1 x10E3/uL        Assessment & Plan Prediabetes chronic Continues to have prediabetes. - Order A1c test. - Advise low-carb diet and refer to American Diabetes Association website for dietary guidance. Will follow-up  Hypertension Chronic Stable Blood pressure well-controlled on lisinopril 5 mg. - Continue lisinopril 5 mg. Continue lifestyle modifications Will follow-up  Hyperlipidemia Chronic, stable On pravastatin 40 mg. - Continue pravastatin 40 mg. Continue healthy diet and daily exercise The 10-year ASCVD risk score (Arnett DK, et al., 2019) is: 12.6% Will follow-up  Subclinical hyperthyroidism/ Thyroid Function Monitoring Periodic monitoring ongoing. Endocrinologist released from regular follow-ups unless issues arise. - Order TSH and T4 tests. Will follow-up  General Health Maintenance Due for tetanus shot. Up to date with eye exams. - Check records for tetanus shot. - Consider tetanus vaccination during next physical.  Follow-up Scheduled for routine follow-ups to monitor chronic conditions and general health. - Annual wellness visit in July. - Physical examination in October. - Follow-up for chronic conditions in six months.   Orders Placed This Encounter  Procedures   Hemoglobin A1c   Lipid panel    Has the patient fasted?:   Yes   TSH   T4, free   Comprehensive metabolic panel with GFR    Has the patient fasted?:   Yes   CBC with Differential/Platelet    Return in about 6 months (around 04/05/2024) for CPE, chronic disease f/u.   The patient was advised to call back or seek an in-person evaluation if the symptoms worsen or if the condition fails to improve as anticipated.  I discussed the assessment and treatment plan with the patient. The patient was provided an opportunity to ask questions and all were answered. The patient agreed with the plan  and demonstrated an understanding of the instructions.  I, Ja Ohman, PA-C have reviewed all documentation for this visit. The documentation on 10/05/2023  for the exam, diagnosis, procedures, and orders are all accurate and complete.  Blane Bunting, [facility], MMS [facility] [phone] (phone) [phone] (fax)  [facility]

## 2023-10-06 LAB — TSH: TSH: 0.368 u[IU]/mL — ABNORMAL LOW (ref 0.450–4.500)

## 2023-10-06 LAB — CBC WITH DIFFERENTIAL/PLATELET
Basophils Absolute: 0 10*3/uL (ref 0.0–0.2)
Basos: 1 %
EOS (ABSOLUTE): 0.1 10*3/uL (ref 0.0–0.4)
Eos: 1 %
Hematocrit: 46.6 % (ref 37.5–51.0)
Hemoglobin: 16.4 g/dL (ref 13.0–17.7)
Immature Grans (Abs): 0 10*3/uL (ref 0.0–0.1)
Immature Granulocytes: 0 %
Lymphocytes Absolute: 1.8 10*3/uL (ref 0.7–3.1)
Lymphs: 35 %
MCH: 32.4 pg (ref 26.6–33.0)
MCHC: 35.2 g/dL (ref 31.5–35.7)
MCV: 92 fL (ref 79–97)
Monocytes Absolute: 0.5 10*3/uL (ref 0.1–0.9)
Monocytes: 9 %
Neutrophils Absolute: 2.8 10*3/uL (ref 1.4–7.0)
Neutrophils: 54 %
Platelets: 235 10*3/uL (ref 150–450)
RBC: 5.06 x10E6/uL (ref 4.14–5.80)
RDW: 12.1 % (ref 11.6–15.4)
WBC: 5.2 10*3/uL (ref 3.4–10.8)

## 2023-10-06 LAB — COMPREHENSIVE METABOLIC PANEL WITH GFR
ALT: 32 IU/L (ref 0–44)
AST: 22 IU/L (ref 0–40)
Albumin: 4.4 g/dL (ref 3.9–4.9)
Alkaline Phosphatase: 76 IU/L (ref 44–121)
BUN/Creatinine Ratio: 12 (ref 10–24)
BUN: 13 mg/dL (ref 8–27)
Bilirubin Total: 0.7 mg/dL (ref 0.0–1.2)
CO2: 21 mmol/L (ref 20–29)
Calcium: 9.8 mg/dL (ref 8.6–10.2)
Chloride: 104 mmol/L (ref 96–106)
Creatinine, Ser: 1.05 mg/dL (ref 0.76–1.27)
Globulin, Total: 2.4 g/dL (ref 1.5–4.5)
Glucose: 111 mg/dL — ABNORMAL HIGH (ref 70–99)
Potassium: 4.8 mmol/L (ref 3.5–5.2)
Sodium: 140 mmol/L (ref 134–144)
Total Protein: 6.8 g/dL (ref 6.0–8.5)
eGFR: 78 mL/min/{1.73_m2} (ref >59)

## 2023-10-06 LAB — LIPID PANEL
Chol/HDL Ratio: 3 ratio (ref 0.0–5.0)
Cholesterol, Total: 165 mg/dL (ref 100–199)
HDL: 55 mg/dL (ref >39)
LDL Chol Calc (NIH): 97 mg/dL (ref 0–99)
Triglycerides: 69 mg/dL (ref 0–149)
VLDL Cholesterol Cal: 13 mg/dL (ref 5–40)

## 2023-10-06 LAB — HEMOGLOBIN A1C
Est. average glucose Bld gHb Est-mCnc: 123 mg/dL
Hgb A1c MFr Bld: 5.9 % — ABNORMAL HIGH (ref 4.8–5.6)

## 2023-10-06 LAB — T4, FREE: Free T4: 1.36 ng/dL (ref 0.82–1.77)

## 2023-10-15 ENCOUNTER — Encounter: Payer: Self-pay | Admitting: Physician Assistant

## 2023-11-29 ENCOUNTER — Other Ambulatory Visit: Payer: Self-pay | Admitting: Physician Assistant

## 2023-11-29 DIAGNOSIS — I10 Essential (primary) hypertension: Secondary | ICD-10-CM

## 2023-11-30 NOTE — Telephone Encounter (Signed)
 Requested Prescriptions  Pending Prescriptions Disp Refills   lisinopril  (ZESTRIL ) 5 MG tablet [Pharmacy Med Name: LISINOPRIL  5 MG TABLET] 90 tablet 0    Sig: TAKE 1 TABLET (5 MG TOTAL) BY MOUTH DAILY.     Cardiovascular:  ACE Inhibitors Passed - 11/30/2023 10:19 AM      Passed - Cr in normal range and within 180 days    Creat  Date Value Ref Range Status  05/10/2017 0.91 0.70 - 1.25 mg/dL Final    Comment:    For patients >68 years of age, the reference limit for Creatinine is approximately 13% higher for people identified as African-American. .    Creatinine, Ser  Date Value Ref Range Status  10/05/2023 1.05 0.76 - 1.27 mg/dL Final         Passed - K in normal range and within 180 days    Potassium  Date Value Ref Range Status  10/05/2023 4.8 3.5 - 5.2 mmol/L Final         Passed - Patient is not pregnant      Passed - Last BP in normal range    BP Readings from Last 1 Encounters:  10/05/23 118/76         Passed - Valid encounter within last 6 months    Recent Outpatient Visits           1 month ago Prediabetes   [place] [facility] [place], [place], PA-C       Future Appointments             In 4 months Ostwalt, Janna, PA-C [facility]

## 2023-12-24 DIAGNOSIS — D485 Neoplasm of uncertain behavior of skin: Secondary | ICD-10-CM | POA: Diagnosis not present

## 2023-12-24 DIAGNOSIS — C44519 Basal cell carcinoma of skin of other part of trunk: Secondary | ICD-10-CM | POA: Diagnosis not present

## 2023-12-24 DIAGNOSIS — D2262 Melanocytic nevi of left upper limb, including shoulder: Secondary | ICD-10-CM | POA: Diagnosis not present

## 2023-12-24 DIAGNOSIS — D2261 Melanocytic nevi of right upper limb, including shoulder: Secondary | ICD-10-CM | POA: Diagnosis not present

## 2023-12-24 DIAGNOSIS — D2272 Melanocytic nevi of left lower limb, including hip: Secondary | ICD-10-CM | POA: Diagnosis not present

## 2023-12-24 DIAGNOSIS — D225 Melanocytic nevi of trunk: Secondary | ICD-10-CM | POA: Diagnosis not present

## 2024-01-07 DIAGNOSIS — C44519 Basal cell carcinoma of skin of other part of trunk: Secondary | ICD-10-CM | POA: Diagnosis not present

## 2024-01-15 ENCOUNTER — Ambulatory Visit (INDEPENDENT_AMBULATORY_CARE_PROVIDER_SITE_OTHER): Payer: Medicare Other

## 2024-01-15 VITALS — BP 124/72 | Ht 71.0 in | Wt 185.1 lb

## 2024-01-15 DIAGNOSIS — Z0000 Encounter for general adult medical examination without abnormal findings: Secondary | ICD-10-CM

## 2024-01-15 NOTE — Progress Notes (Signed)
 Subjective:   Wayne Holland is a 68 y.o. who presents for a Medicare Wellness preventive visit.  As a reminder, Annual Wellness Visits don't include a physical exam, and some assessments may be limited, especially if this visit is performed virtually. We may recommend an in-person follow-up visit with your provider if needed.  Visit Complete: In person  Persons Participating in Visit: Patient.  AWV Questionnaire: No: Patient Medicare AWV questionnaire was not completed prior to this visit.  Cardiac Risk Factors include: advanced age (>18men, >74 women);dyslipidemia;male gender;hypertension     Objective:    Today's Vitals   01/15/24 0927  BP: 124/72  Weight: 185 lb 1.6 oz (84 kg)  Height: 5' 11 (1.803 m)   Body mass index is 25.82 kg/m.     01/15/2024    9:35 AM 01/08/2023    8:17 AM 01/03/2022    8:18 AM 04/08/2021    7:13 AM 04/05/2021   11:24 AM  Advanced Directives  Does Patient Have a Medical Advance Directive? No No No No No  Would patient like information on creating a medical advance directive? No - Patient declined  No - Patient declined No - Patient declined     Current Medications (verified) Outpatient Encounter Medications as of 01/15/2024  Medication Sig   lisinopril  (ZESTRIL ) 5 MG tablet TAKE 1 TABLET (5 MG TOTAL) BY MOUTH DAILY.   pravastatin  (PRAVACHOL ) 40 MG tablet TAKE 1 TABLET BY MOUTH EVERY DAY   No facility-administered encounter medications on file as of 01/15/2024.    Allergies (verified) Patient has no known allergies.   History: Past Medical History:  Diagnosis Date   History of kidney stones    Hypertension    Pre-diabetes    Sleep apnea    Past Surgical History:  Procedure Laterality Date   COLONOSCOPY  2007   COLONOSCOPY WITH PROPOFOL  N/A 06/28/2016   Procedure: COLONOSCOPY WITH PROPOFOL ;  Surgeon: Reyes LELON Cota, MD;  Location: [facility] ENDOSCOPY;  Service: Endoscopy;  Laterality: N/A;   CYSTOSCOPY WITH LITHOLAPAXY N/A  04/08/2021   Procedure: CYSTOSCOPY WITH LITHOLAPAXY;  Surgeon: Francisca Redell BROCKS, MD;  Location: [facility] ORS;  Service: Urology;  Laterality: N/A;   HOLEP-LASER ENUCLEATION OF THE PROSTATE WITH MORCELLATION N/A 04/08/2021   Procedure: HOLEP-LASER ENUCLEATION OF THE PROSTATE WITH MORCELLATION;  Surgeon: Francisca Redell BROCKS, MD;  Location: [facility] ORS;  Service: Urology;  Laterality: N/A;   NO PAST SURGERIES     Family History  Problem Relation Age of Onset   Dementia Mother    Ovarian cancer Mother    Stroke Father    Diabetes Father    Colon cancer Neg Hx    Social History   Socioeconomic History   Marital status: Married    Spouse name: Sharlet   Number of children: Not on file   Years of education: Not on file   Highest education level: 12th grade  Occupational History   Not on file  Tobacco Use   Smoking status: Former    Current packs/day: 0.00    Types: Cigarettes    Quit date: 06/27/1975    Years since quitting: 48.5    Passive exposure: Past   Smokeless tobacco: Never   Tobacco comments:    QUIT IN 1970'S  Substance and Sexual Activity   Alcohol use: Yes    Alcohol/week: 1.0 standard drink of alcohol    Types: 1 Cans of beer per week    Comment: OCCASIONALLY   Drug use: No  Sexual activity: Yes    Birth control/protection: None  Other Topics Concern   Not on file  Social History Narrative   Not on file   Social Drivers of Health   Financial Resource Strain: Low Risk  (01/15/2024)   Overall Financial Resource Strain (CARDIA)    Difficulty of Paying Living Expenses: Not hard at all  Food Insecurity: No Food Insecurity (01/15/2024)   Hunger Vital Sign    Worried About Running Out of Food in the Last Year: Never true    Ran Out of Food in the Last Year: Never true  Transportation Needs: No Transportation Needs (01/15/2024)   PRAPARE - Administrator, Civil Service (Medical): No    Lack of Transportation (Non-Medical): No  Physical Activity: Sufficiently  Active (01/15/2024)   Exercise Vital Sign    Days of Exercise per Week: 5 days    Minutes of Exercise per Session: 40 min  Stress: No Stress Concern Present (01/15/2024)   [employer] of Occupational Health - Occupational Stress Questionnaire    Feeling of Stress: Not at all  Social Connections: Moderately Isolated (01/15/2024)   Social Connection and Isolation Panel    Frequency of Communication with Friends and Family: Once a week    Frequency of Social Gatherings with Friends and Family: Once a week    Attends Religious Services: More than 4 times per year    Active Member of [employer] or Organizations: No    Attends Engineer, structural: Never    Marital Status: Married    Tobacco Counseling Counseling given: Not Answered Tobacco comments: QUIT IN 1970'S    Clinical Intake:  Pre-visit preparation completed: Yes  Pain : No/denies pain     BMI - recorded: 25.82 Nutritional Status: BMI 25 -29 Overweight Nutritional Risks: None Diabetes: No  Lab Results  Component Value Date   HGBA1C 5.9 (H) 10/05/2023   HGBA1C 6.3 (H) 04/06/2023   HGBA1C 6.1 (H) 10/04/2022     How often do you need to have someone help you when you read instructions, pamphlets, or other written materials from your doctor or pharmacy?: 1 - Never  Interpreter Needed?: No  Information entered by :: JHONNIE DAS, LPN   Activities of Daily Living    01/15/2024    9:37 AM 01/11/2024   12:45 PM  In your present state of health, do you have any difficulty performing the following activities:  Hearing? 0 0  Vision? 0 0  Difficulty concentrating or making decisions? 0 0  Walking or climbing stairs? 0 0  Dressing or bathing? 0 0  Doing errands, shopping? 0 0  Preparing Food and eating ? N N  Using the Toilet? N N  In the past six months, have you accidently leaked urine? N N  Do you have problems with loss of bowel control? N N  Managing your Medications? N N  Managing your Finances? N  N  Housekeeping or managing your Housekeeping? N N    Patient Care Team: Ostwalt, Janna, PA-C as PCP - General (Physician Assistant) Dessa Reyes ORN, MD (General Surgery) Laurice Francis NOVAK, OD (Optometry)  I have updated your Care Teams any recent Medical Services you may have received from other providers in the past year.     Assessment:   This is a routine wellness examination for [employer].  Hearing/Vision screen Hearing Screening - Comments:: NO AIDS Vision Screening - Comments:: WEARS GLASSES ALL DAY- NICE EYE CARE   Goals Addressed  This Visit's Progress    DIET - INCREASE WATER INTAKE         Depression Screen     01/15/2024    9:34 AM 10/05/2023    8:52 AM 04/06/2023    8:12 AM 01/08/2023    8:10 AM 10/04/2022    9:57 AM 01/03/2022    8:15 AM 12/13/2021    8:47 AM  PHQ 2/9 Scores  PHQ - 2 Score 0 0 0 0 0 0 0  PHQ- 9 Score 0 0 0  0 0 0    Fall Risk     01/15/2024    9:37 AM 01/11/2024   12:45 PM 04/06/2023    8:11 AM 01/06/2023    4:42 PM 10/04/2022    9:57 AM  Fall Risk   Falls in the past year? 0 0 0 0 0  Number falls in past yr: 0 0 0 0 0  Injury with Fall? 0 0 0 0 0  Risk for fall due to : No Fall Risks      Follow up Falls evaluation completed;Falls prevention discussed   Falls prevention discussed;Education provided     MEDICARE RISK AT HOME:  Medicare Risk at Home Any stairs in or around the home?: Yes If so, are there any without handrails?: No Home free of loose throw rugs in walkways, pet beds, electrical cords, etc?: Yes Adequate lighting in your home to reduce risk of falls?: Yes Life alert?: No Use of a cane, walker or w/c?: No Grab bars in the bathroom?: No Shower chair or bench in shower?: No Elevated toilet seat or a handicapped toilet?: No  TIMED UP AND GO:  Was the test performed?  Yes  Length of time to ambulate 10 feet: 4 sec Gait steady and fast without use of assistive device  Cognitive Function: 6CIT completed         01/15/2024    9:38 AM 01/08/2023    8:19 AM  6CIT Screen  What Year? 0 points 0 points  What month? 0 points 0 points  What time? 0 points 0 points  Count back from 20 0 points 0 points  Months in reverse 0 points 0 points  Repeat phrase 0 points 0 points  Total Score 0 points 0 points    Immunizations Immunization History  Administered Date(s) Administered   Fluad Trivalent(High Dose 65+) 04/06/2023   Influenza Split 04/09/2012   Influenza, High Dose Seasonal PF 05/08/2021   Influenza,inj,Quad PF,6+ Mos 04/17/2013, 04/24/2014, 04/26/2015, 05/04/2016, 05/10/2017, 05/13/2018, 03/27/2019, 05/11/2020   Influenza-Unspecified 03/27/2019   PFIZER Comirnaty(Gray Top)Covid-19 Tri-Sucrose Vaccine 11/15/2020   PFIZER(Purple Top)SARS-COV-2 Vaccination 08/31/2019, 09/22/2019, 05/19/2020   PNEUMOCOCCAL CONJUGATE-20 01/28/2023   Tdap 04/07/2011   Zoster Recombinant(Shingrix) 11/18/2022, 01/28/2023   Zoster, Live 05/04/2016    Screening Tests Health Maintenance  Topic Date Due   DTaP/Tdap/Td (2 - Td or Tdap) 04/06/2021   COVID-19 Vaccine (5 - 2024-25 season) 02/25/2023   INFLUENZA VACCINE  01/25/2024   Medicare Annual Wellness (AWV)  01/14/2025   Colonoscopy  06/28/2026   Pneumococcal Vaccine: 50+ Years  Completed   Hepatitis C Screening  Completed   Zoster Vaccines- Shingrix  Completed   Hepatitis B Vaccines  Aged Out   HPV VACCINES  Aged Out   Meningococcal B Vaccine  Aged Out    Health Maintenance  Health Maintenance Due  Topic Date Due   DTaP/Tdap/Td (2 - Td or Tdap) 04/06/2021   COVID-19 Vaccine (5 - 2024-25 season) 02/25/2023  Health Maintenance Items Addressed: UP TO DATE ON COLONOSCOPY; UP TO DATE ON PNA, SHINGRIX; NEEDS TDAP, COVID  Additional Screening:  Vision Screening: Recommended annual ophthalmology exams for early detection of glaucoma and other disorders of the eye. Would you like a referral to an eye doctor? No    Dental Screening: Recommended  annual dental exams for proper oral hygiene  Community Resource Referral / Chronic Care Management: CRR required this visit?  No   CCM required this visit?  No   Plan:    I have personally reviewed and noted the following in the patient's chart:   Medical and social history Use of alcohol, tobacco or illicit drugs  Current medications and supplements including opioid prescriptions. Patient is not currently taking opioid prescriptions. Functional ability and status Nutritional status Physical activity Advanced directives List of other physicians Hospitalizations, surgeries, and ER visits in previous 12 months Vitals Screenings to include cognitive, depression, and falls Referrals and appointments  In addition, I have reviewed and discussed with patient certain preventive protocols, quality metrics, and best practice recommendations. A written personalized care plan for preventive services as well as general preventive health recommendations were provided to patient.   Jhonnie GORMAN Das, LPN   [id number]   After Visit Summary: (In Person-Declined) Patient declined AVS at this time.  Notes: Nothing significant to report at this time.

## 2024-01-15 NOTE — Patient Instructions (Signed)
 Wayne Holland , Thank you for taking time out of your busy schedule to complete your Annual Wellness Visit with me. I enjoyed our conversation and look forward to speaking with you again next year. I, as well as your care team,  appreciate your ongoing commitment to your health goals. Please review the following plan we discussed and let me know if I can assist you in the future.   Follow up Visits: Next Medicare AWV with our clinical staff:   01/20/25 @ 9:30 AM IN PERSON Have you seen your provider in the last 6 months (3 months if uncontrolled diabetes)? Yes   Clinician Recommendations:  Aim for 30 minutes of exercise or brisk walking, 6-8 glasses of water, and 5 servings of fruits and vegetables each day. TAKE CARE!      This is a list of the screening recommended for you and due dates:  Health Maintenance  Topic Date Due   DTaP/Tdap/Td vaccine (2 - Td or Tdap) 04/06/2021   COVID-19 Vaccine (5 - 2024-25 season) 02/25/2023   Flu Shot  01/25/2024   Medicare Annual Wellness Visit  01/14/2025   Colon Cancer Screening  06/28/2026   Pneumococcal Vaccine for age over 87  Completed   Hepatitis C Screening  Completed   Zoster (Shingles) Vaccine  Completed   Hepatitis B Vaccine  Aged Out   HPV Vaccine  Aged Out   Meningitis B Vaccine  Aged Out    Advanced directives: (ACP Link)Information on Advanced Care Planning can be found at [employer] Advance Health Care Directives Advance Health Care Directives. [email or web address]  Advance Care Planning is important because it:  [x]  Makes sure you receive the medical care that is consistent with your values, goals, and preferences  [x]  It provides guidance to your family and loved ones and reduces their decisional burden about whether or not they are making the right decisions based on your wishes.  Follow the link provided in your after visit summary or read over the paperwork we have mailed to you to help you started getting your  Advance Directives in place. If you need assistance in completing these, please reach out to us  so that we can help you!

## 2024-02-25 ENCOUNTER — Other Ambulatory Visit: Payer: Self-pay | Admitting: Physician Assistant

## 2024-02-25 DIAGNOSIS — I10 Essential (primary) hypertension: Secondary | ICD-10-CM

## 2024-03-26 ENCOUNTER — Other Ambulatory Visit: Payer: Self-pay | Admitting: Physician Assistant

## 2024-03-26 DIAGNOSIS — E7800 Pure hypercholesterolemia, unspecified: Secondary | ICD-10-CM

## 2024-03-26 DIAGNOSIS — I10 Essential (primary) hypertension: Secondary | ICD-10-CM

## 2024-04-07 ENCOUNTER — Encounter: Payer: Self-pay | Admitting: Physician Assistant

## 2024-04-07 ENCOUNTER — Ambulatory Visit: Admitting: Physician Assistant

## 2024-04-07 VITALS — BP 135/89 | HR 60 | Resp 14 | Ht 71.0 in | Wt 181.0 lb

## 2024-04-07 DIAGNOSIS — Z0000 Encounter for general adult medical examination without abnormal findings: Secondary | ICD-10-CM | POA: Insufficient documentation

## 2024-04-07 DIAGNOSIS — R7989 Other specified abnormal findings of blood chemistry: Secondary | ICD-10-CM

## 2024-04-07 DIAGNOSIS — R7309 Other abnormal glucose: Secondary | ICD-10-CM | POA: Diagnosis not present

## 2024-04-07 DIAGNOSIS — R0683 Snoring: Secondary | ICD-10-CM

## 2024-04-07 DIAGNOSIS — Z23 Encounter for immunization: Secondary | ICD-10-CM | POA: Diagnosis not present

## 2024-04-07 DIAGNOSIS — I10 Essential (primary) hypertension: Secondary | ICD-10-CM

## 2024-04-07 DIAGNOSIS — E7800 Pure hypercholesterolemia, unspecified: Secondary | ICD-10-CM

## 2024-04-07 NOTE — Progress Notes (Unsigned)
 Complete physical exam  Patient: Wayne Holland   DOB: October 20, 1955   68 y.o. Male  MRN: [id number] Visit Date: 04/07/2024  Today's healthcare provider: Jolynn Spencer, PA-C   Chief Complaint  Patient presents with   Annual Exam    Sleep pattern: 7-8 hours , sleeping good  Exercise: Yes, 14-15 days out of the month No concerns     Hypertension    Taking medications as directed. Not monitoring BP   Hyperlipidemia   Subjective    Wayne Holland is a 68 y.o. male who presents today for a complete physical exam.    Discussed the use of AI scribe software for clinical note transcription with the patient, who gave verbal consent to proceed.  History of Present Illness Wayne Holland is a 68 year old male who presents for a routine follow-up.  He has hypertension managed with lisinopril  5 mg daily, though his blood pressure readings are slightly above the target of less than 130/80 mmHg. He manages prediabetes with dietary changes. He continues pravastatin  40 mg for hyperlipidemia.  Subclinical hyperthyroidism is present but asymptomatic, with no medication required.  He has sleep apnea but does not use his CPAP machine due to discomfort. He sleeps on an elevated bed and believes his sleep quality is adequate.  He experiences post-nasal drainage and nasal congestion, particularly in the morning, likely due to allergies.  He maintains a healthy diet and regular exercise, walking two to three miles daily when not working. He attends regular dental visits every six months and eye exams every two years. He sees a dermatologist annually and had a non-dangerous lesion removed from his back.    Last depression screening scores    04/07/2024    8:28 AM 01/15/2024    9:34 AM 10/05/2023    8:52 AM  PHQ 2/9 Scores  PHQ - 2 Score 0 0 0  PHQ- 9 Score  0 0   Last fall risk screening    04/07/2024    8:28 AM  Fall Risk   Falls in the past year? 0  Number falls  in past yr: 0  Injury with Fall? 0  Risk for fall due to : No Fall Risks  Follow up Falls evaluation completed   Last Audit-C alcohol use screening    01/15/2024    9:33 AM  Alcohol Use Disorder Test (AUDIT)  1. How often do you have a drink containing alcohol? 1  2. How many drinks containing alcohol do you have on a typical day when you are drinking? 0  3. How often do you have six or more drinks on one occasion? 0  AUDIT-C Score 1   A score of 3 or more in women, and 4 or more in men indicates increased risk for alcohol abuse, EXCEPT if all of the points are from question 1   Past Medical History:  Diagnosis Date   History of kidney stones    Hypertension    Pre-diabetes    Sleep apnea    Past Surgical History:  Procedure Laterality Date   COLONOSCOPY  2007   COLONOSCOPY WITH PROPOFOL  N/A 06/28/2016   Procedure: COLONOSCOPY WITH PROPOFOL ;  Surgeon: Reyes LELON Cota, MD;  Location: [facility];  Service: Endoscopy;  Laterality: N/A;   CYSTOSCOPY WITH LITHOLAPAXY N/A 04/08/2021   Procedure: CYSTOSCOPY WITH LITHOLAPAXY;  Surgeon: Francisca Redell BROCKS, MD;  Location: [facility];  Service: Urology;  Laterality: N/A;   HOLEP-LASER ENUCLEATION OF  THE PROSTATE WITH MORCELLATION N/A 04/08/2021   Procedure: HOLEP-LASER ENUCLEATION OF THE PROSTATE WITH MORCELLATION;  Surgeon: Francisca Redell BROCKS, MD;  Location: [facility];  Service: Urology;  Laterality: N/A;   NO PAST SURGERIES     Social History   Socioeconomic History   Marital status: Married    Spouse name: Sharlet   Number of children: Not on file   Years of education: Not on file   Highest education level: 12th grade  Occupational History   Not on file  Tobacco Use   Smoking status: Former    Current packs/day: 0.00    Types: Cigarettes    Quit date: 06/27/1975    Years since quitting: 48.8    Passive exposure: Past   Smokeless tobacco: Never   Tobacco comments:    QUIT IN 1970'S  Substance and Sexual Activity   Alcohol  use: Yes    Alcohol/week: 1.0 standard drink of alcohol    Types: 1 Cans of beer per week    Comment: OCCASIONALLY   Drug use: No   Sexual activity: Yes    Birth control/protection: None  Other Topics Concern   Not on file  Social History Narrative   Not on file   Social Drivers of Health   Financial Resource Strain: Low Risk  (01/15/2024)   Overall Financial Resource Strain (CARDIA)    Difficulty of Paying Living Expenses: Not hard at all  Food Insecurity: No Food Insecurity (01/15/2024)   Hunger Vital Sign    Worried About Running Out of Food in the Last Year: Never true    Ran Out of Food in the Last Year: Never true  Transportation Needs: No Transportation Needs (01/15/2024)   PRAPARE - Administrator, Civil Service (Medical): No    Lack of Transportation (Non-Medical): No  Physical Activity: Sufficiently Active (01/15/2024)   Exercise Vital Sign    Days of Exercise per Week: 5 days    Minutes of Exercise per Session: 40 min  Stress: No Stress Concern Present (01/15/2024)   [employer] of Occupational Health - Occupational Stress Questionnaire    Feeling of Stress: Not at all  Social Connections: Moderately Isolated (01/15/2024)   Social Connection and Isolation Panel    Frequency of Communication with Friends and Family: Once a week    Frequency of Social Gatherings with Friends and Family: Once a week    Attends Religious Services: More than 4 times per year    Active Member of [employer] or Organizations: No    Attends Banker Meetings: Never    Marital Status: Married  Catering manager Violence: Not At Risk (01/15/2024)   Humiliation, Afraid, Rape, and Kick questionnaire    Fear of Current or Ex-Partner: No    Emotionally Abused: No    Physically Abused: No    Sexually Abused: No   Family Status  Relation Name Status   Mother  Deceased at age 61   Father Vernell Deceased at age 92   Neg Hx  (Not Specified)  No partnership data on file    Family History  Problem Relation Age of Onset   Dementia Mother    Ovarian cancer Mother    Stroke Father    Diabetes Father    Colon cancer Neg Hx    No Known Allergies  Patient Care Team: Zimir Kittleson, PA-C as PCP - General (Physician Assistant) Dessa, Reyes ORN, MD (General Surgery) Laurice Francis NOVAK, OD (Optometry)   Medications: Outpatient  Medications Prior to Visit  Medication Sig   lisinopril  (ZESTRIL ) 5 MG tablet TAKE 1 TABLET (5 MG TOTAL) BY MOUTH DAILY.   pravastatin  (PRAVACHOL ) 40 MG tablet TAKE 1 TABLET BY MOUTH EVERY DAY   No facility-administered medications prior to visit.    Review of Systems  All other systems reviewed and are negative.  Except see HPI  {Insert previous labs (optional):23779} {See past labs  Heme  Chem  Endocrine  Serology  Results Review (optional):1}  Objective    BP 135/89   Pulse 60   Resp 14   Ht 5' 11 (1.803 m)   Wt 181 lb (82.1 kg)   SpO2 99%   BMI 25.24 kg/m  {Insert last BP/Wt (optional):23777}{See vitals history (optional):1}    Physical Exam Vitals reviewed.  Constitutional:      General: He is not in acute distress.    Appearance: Normal appearance. He is well-developed. He is not ill-appearing, toxic-appearing or diaphoretic.  HENT:     Head: Normocephalic and atraumatic.     Right Ear: Tympanic membrane, ear canal and external ear normal.     Left Ear: Tympanic membrane, ear canal and external ear normal.     Nose: Nose normal. No congestion or rhinorrhea.     Mouth/Throat:     Mouth: Mucous membranes are moist.     Pharynx: Oropharynx is clear. No oropharyngeal exudate.  Eyes:     General: No scleral icterus.       Right eye: No discharge.        Left eye: No discharge.     Conjunctiva/sclera: Conjunctivae normal.     Pupils: Pupils are equal, round, and reactive to light.  Neck:     Thyroid : No thyromegaly.     Vascular: No carotid bruit.  Cardiovascular:     Rate and Rhythm: Normal rate and  regular rhythm.     Pulses: Normal pulses.     Heart sounds: Normal heart sounds. No murmur heard.    No friction rub. No gallop.  Pulmonary:     Effort: Pulmonary effort is normal. No respiratory distress.     Breath sounds: Normal breath sounds. No wheezing or rales.  Abdominal:     General: Abdomen is flat. Bowel sounds are normal. There is no distension.     Palpations: Abdomen is soft. There is no mass.     Tenderness: There is no abdominal tenderness. There is no right CVA tenderness, left CVA tenderness, guarding or rebound.     Hernia: No hernia is present.  Musculoskeletal:        General: No swelling, tenderness, deformity or signs of injury. Normal range of motion.     Cervical back: Normal range of motion and neck supple. No rigidity or tenderness.     Right lower leg: No edema.     Left lower leg: No edema.  Lymphadenopathy:     Cervical: No cervical adenopathy.  Skin:    General: Skin is warm and dry.     Coloration: Skin is not jaundiced or pale.     Findings: No bruising, erythema, lesion or rash.  Neurological:     Mental Status: He is alert and oriented to person, place, and time. Mental status is at baseline.     Gait: Gait normal.  Psychiatric:        Mood and Affect: Mood normal.        Behavior: Behavior normal.        Thought  Content: Thought content normal.        Judgment: Judgment normal.      No results found for any visits on 04/07/24.  Assessment & Plan    Routine Health Maintenance and Physical Exam  Exercise Activities and Dietary recommendations  Goals      DIET - EAT MORE FRUITS AND VEGETABLES     DIET - INCREASE WATER INTAKE        Immunization History  Administered Date(s) Administered   Fluad Trivalent(High Dose 65+) 04/06/2023   INFLUENZA, HIGH DOSE SEASONAL PF 05/08/2021, 04/07/2024   Influenza Split 04/09/2012   Influenza,inj,Quad PF,6+ Mos 04/17/2013, 04/24/2014, 04/26/2015, 05/04/2016, 05/10/2017, 05/13/2018, 03/27/2019,  05/11/2020   Influenza-Unspecified 03/27/2019   PFIZER Comirnaty(Gray Top)Covid-19 Tri-Sucrose Vaccine 11/15/2020   PFIZER(Purple Top)SARS-COV-2 Vaccination 08/31/2019, 09/22/2019, 05/19/2020   PNEUMOCOCCAL CONJUGATE-20 01/28/2023   Tdap 04/07/2011   Zoster Recombinant(Shingrix) 11/18/2022, 01/28/2023   Zoster, Live 05/04/2016    Health Maintenance  Topic Date Due   DTaP/Tdap/Td vaccine (2 - Td or Tdap) 04/06/2021   COVID-19 Vaccine (5 - 2025-26 season) 02/25/2024   Medicare Annual Wellness Visit  01/14/2025   Colon Cancer Screening  06/28/2026   Pneumococcal Vaccine for age over 11  Completed   Flu Shot  Completed   Hepatitis C Screening  Completed   Zoster (Shingles) Vaccine  Completed   Meningitis B Vaccine  Aged Out    Discussed health benefits of physical activity, and encouraged him to engage in regular exercise appropriate for his age and condition.  Assessment & Plan Essential hypertension Chronic  Blood pressure slightly elevated above target. On lisinopril  5 mg daily. - Monitor blood pressure at home regularly. - Consider increasing lisinopril  dosage or adding medication Continue lifestyle modifications RTC if BP at home will stay elevated/>130/80  Hyperlipidemia Chronic and previously stable On pravastatin  40 mg daily. Cardiovascular risk 12.6%. - Consider adding a different med if indicated by lab results.  Prediabetes Chronic and stable Managed with dietary changes. - Order A1c test to monitor glucose control. Will follow-up  Subclinical hyperthyroidism Chronic and stable Thyroid  function monitored. Released from endocrinologist follow-ups. - Order TSH and T4 tests to ensure stability.  Obstructive sleep apnea Not using CPAP due to discomfort. Sleeps on elevated bed. - Encourage return to CPAP use for improved sleep quality.  Allergic rhinitis Post-nasal drainage and congestion likely due to chronic allergies - Avoidance measures discussed. - Use  nasal saline rinses before nose sprays such as with Neilmed Sinus Rinse bottle.  Use distilled water.   - Use Flonase 2 sprays each nostril daily. Aim upward and outward. - Use Zyrtec 10 mg daily.   General Health Maintenance Due for COVID and tetanus vaccinations. Flu shot received. Dental, eye exams, and dermatology check-ups up to date. - Obtain COVID and tetanus vaccinations at [facility]. - Send vaccination receipts through [employer]. - Continue regular dental and eye exams. - Continue annual dermatology check-ups.   Return for chronic disease f/u in 6 mo, CPE in a year.    The patient was advised to call back or seek an in-person evaluation if the symptoms worsen or if the condition fails to improve as anticipated.  I discussed the assessment and treatment plan with the patient. The patient was provided an opportunity to ask questions and all were answered. The patient agreed with the plan and demonstrated an understanding of the instructions.  I, Kieth Hartis, PA-C have reviewed all documentation for this visit. The documentation on 04/07/2024  for the exam,  diagnosis, procedures, and orders are all accurate and complete.  Jolynn Spencer, [facility], MMS [facility] [phone] (phone) [phone] (fax)  [facility]

## 2024-04-08 ENCOUNTER — Ambulatory Visit: Payer: Self-pay | Admitting: Physician Assistant

## 2024-04-08 LAB — LIPID PANEL WITH LDL/HDL RATIO
Cholesterol, Total: 162 mg/dL (ref 100–199)
HDL: 59 mg/dL (ref >39)
LDL Chol Calc (NIH): 89 mg/dL (ref 0–99)
LDL/HDL Ratio: 1.5 ratio (ref 0.0–3.6)
Triglycerides: 71 mg/dL (ref 0–149)
VLDL Cholesterol Cal: 14 mg/dL (ref 5–40)

## 2024-04-08 LAB — HEMOGLOBIN A1C
Est. average glucose Bld gHb Est-mCnc: 126 mg/dL
Hgb A1c MFr Bld: 6 % — ABNORMAL HIGH (ref 4.8–5.6)

## 2024-04-08 LAB — COMPREHENSIVE METABOLIC PANEL WITH GFR
ALT: 26 IU/L (ref 0–44)
AST: 20 IU/L (ref 0–40)
Albumin: 4.6 g/dL (ref 3.9–4.9)
Alkaline Phosphatase: 78 IU/L (ref 47–123)
BUN/Creatinine Ratio: 13 (ref 10–24)
BUN: 12 mg/dL (ref 8–27)
Bilirubin Total: 0.9 mg/dL (ref 0.0–1.2)
CO2: 23 mmol/L (ref 20–29)
Calcium: 9.9 mg/dL (ref 8.6–10.2)
Chloride: 102 mmol/L (ref 96–106)
Creatinine, Ser: 0.95 mg/dL (ref 0.76–1.27)
Globulin, Total: 2.3 g/dL (ref 1.5–4.5)
Glucose: 108 mg/dL — ABNORMAL HIGH (ref 70–99)
Potassium: 5.1 mmol/L (ref 3.5–5.2)
Sodium: 139 mmol/L (ref 134–144)
Total Protein: 6.9 g/dL (ref 6.0–8.5)
eGFR: 87 mL/min/1.73 (ref >59)

## 2024-04-08 LAB — T4: T4, Total: 7.8 ug/dL (ref 4.5–12.0)

## 2024-04-08 LAB — TSH: TSH: 0.329 u[IU]/mL — ABNORMAL LOW (ref 0.450–4.500)

## 2024-05-23 ENCOUNTER — Telehealth: Payer: Self-pay

## 2024-05-28 ENCOUNTER — Other Ambulatory Visit: Payer: Self-pay | Admitting: Physician Assistant

## 2024-05-28 DIAGNOSIS — I10 Essential (primary) hypertension: Secondary | ICD-10-CM

## 2024-10-06 ENCOUNTER — Ambulatory Visit: Admitting: Physician Assistant

## 2025-01-20 ENCOUNTER — Ambulatory Visit

## 2025-04-09 ENCOUNTER — Encounter: Admitting: Physician Assistant
